# Patient Record
Sex: Male | Born: 1965 | Race: White | Hispanic: No | Smoking: Never smoker
Health system: Southern US, Community
[De-identification: ages and names within clinical notes are randomized; demographics above are authoritative.]

## PROBLEM LIST (undated history)

## (undated) DIAGNOSIS — Z87442 Personal history of urinary calculi: Secondary | ICD-10-CM

## (undated) DIAGNOSIS — N200 Calculus of kidney: Secondary | ICD-10-CM

## (undated) DIAGNOSIS — I10 Essential (primary) hypertension: Secondary | ICD-10-CM

## (undated) HISTORY — PX: JOINT REPLACEMENT: SHX530

## (undated) HISTORY — DX: Calculus of kidney: N20.0

---

## 2008-08-23 ENCOUNTER — Ambulatory Visit: Payer: Self-pay | Admitting: General Practice

## 2011-08-15 ENCOUNTER — Other Ambulatory Visit: Payer: Self-pay | Admitting: Internal Medicine

## 2011-08-16 ENCOUNTER — Telehealth: Payer: Self-pay | Admitting: Internal Medicine

## 2011-08-16 NOTE — Telephone Encounter (Signed)
[phone] work.   Pt has had problems getting his rx filled.  His pharmacy [facility].   Bryan Galloway has been trying to call here,   Anxiety, BP, Hydrocodine.  Pt signed medial release form at [facility] 1  1/2 ago.    Pt is out of meds

## 2011-08-17 MED ORDER — BISOPROLOL-HYDROCHLOROTHIAZIDE 5-6.25 MG PO TABS: 1.0000 | ORAL_TABLET | Freq: Every day | ORAL | Status: DC

## 2011-10-22 ENCOUNTER — Other Ambulatory Visit: Payer: Self-pay | Admitting: Internal Medicine

## 2011-10-22 MED ORDER — LISINOPRIL 10 MG PO TABS: 10.0000 mg | ORAL_TABLET | Freq: Every day | ORAL | Status: DC

## 2011-10-22 NOTE — Telephone Encounter (Signed)
Refill on lisinopril  10  Mg daily  #30 6 refills authorized

## 2012-02-06 ENCOUNTER — Other Ambulatory Visit: Payer: Self-pay | Admitting: Internal Medicine

## 2012-02-06 NOTE — Telephone Encounter (Signed)
Patient has not been seen in office yet.  Please advise. 

## 2012-03-26 ENCOUNTER — Telehealth: Payer: Self-pay | Admitting: Internal Medicine

## 2012-03-26 ENCOUNTER — Other Ambulatory Visit: Payer: Self-pay | Admitting: Internal Medicine

## 2012-03-26 NOTE — Telephone Encounter (Signed)
He needs to have his electrolytes and kidney function checked every 6 months while taking this medication.  This is pretty standard and for his own protection .  If [facility] has checked his kidney function,  He can have his medication refilled, but I would appreciate it if he asked them to send me copies of the labs.

## 2012-03-26 NOTE — Telephone Encounter (Signed)
Left message asking patient to return my call.

## 2012-03-26 NOTE — Telephone Encounter (Signed)
Patient called and requested his BP medication be sent to the pharmacy, I advised him per Dr. Darrick Huntsman that he needed an appt prior to any refills.   Patient stated he will not make an appt because he doesn't have anything going on right now except for an upcoming knee replacement, and [facility] was already handling that.  I spoke with Dr. Darrick Huntsman and she stated he needs an appt because he is on BP medication and his kidney function needs to be monitored, and he will probably need medical clearance for the upcoming surgery.  He refused to make an appt still and wants the medication called in, he also stated he does not need medical clearance.  He asked to speak to Dr. Darrick Huntsman and I advised she was with a patient and that I would call him back when she advised on the medication.

## 2012-03-27 NOTE — Telephone Encounter (Signed)
Patient notified, he made an appt for tomorrow. 

## 2012-03-28 ENCOUNTER — Ambulatory Visit (INDEPENDENT_AMBULATORY_CARE_PROVIDER_SITE_OTHER): Payer: Self-pay | Admitting: Internal Medicine

## 2012-03-28 VITALS — BP 128/72 | HR 86 | Temp 98.4°F | Resp 16 | Ht 75.0 in | Wt 253.5 lb

## 2012-03-28 DIAGNOSIS — Z79899 Other long term (current) drug therapy: Secondary | ICD-10-CM

## 2012-03-28 DIAGNOSIS — M1990 Unspecified osteoarthritis, unspecified site: Secondary | ICD-10-CM

## 2012-03-28 DIAGNOSIS — I10 Essential (primary) hypertension: Secondary | ICD-10-CM

## 2012-03-28 LAB — COMPREHENSIVE METABOLIC PANEL
ALT: 16 U/L (ref 0–53)
AST: 12 U/L (ref 0–37)
Albumin: 4.8 g/dL (ref 3.5–5.2)
Alkaline Phosphatase: 47 U/L (ref 39–117)
BUN: 17 mg/dL (ref 6–23)
CO2: 24 mEq/L (ref 19–32)
Calcium: 9.4 mg/dL (ref 8.4–10.5)
Chloride: 103 mEq/L (ref 96–112)
Creat: 0.93 mg/dL (ref 0.50–1.35)
Glucose, Bld: 111 mg/dL — ABNORMAL HIGH (ref 70–99)
Potassium: 4.1 mEq/L (ref 3.5–5.3)
Sodium: 139 mEq/L (ref 135–145)
Total Bilirubin: 0.5 mg/dL (ref 0.3–1.2)
Total Protein: 6.7 g/dL (ref 6.0–8.3)

## 2012-03-28 MED ORDER — HYDROCODONE-ACETAMINOPHEN 5-500 MG PO TABS: 1.0000 | ORAL_TABLET | Freq: Four times a day (QID) | ORAL | Status: AC | PRN

## 2012-03-28 MED ORDER — BISOPROLOL-HYDROCHLOROTHIAZIDE 5-6.25 MG PO TABS: 1.0000 | ORAL_TABLET | Freq: Every day | ORAL | Status: DC

## 2012-03-30 ENCOUNTER — Encounter: Payer: Self-pay | Admitting: Internal Medicine

## 2012-03-30 DIAGNOSIS — G8929 Other chronic pain: Secondary | ICD-10-CM | POA: Insufficient documentation

## 2012-03-30 DIAGNOSIS — I10 Essential (primary) hypertension: Secondary | ICD-10-CM | POA: Insufficient documentation

## 2012-03-30 DIAGNOSIS — N200 Calculus of kidney: Secondary | ICD-10-CM | POA: Insufficient documentation

## 2012-03-30 NOTE — Assessment & Plan Note (Addendum)
awaiting hip replacement.  Has pain worse in the evening,  Using meloxiocam in the daytime.  rx for vicodin #120 given.

## 2012-03-30 NOTE — Assessment & Plan Note (Signed)
  well controlled on current regimen. He has had no renal calculi in years despite taking hctz.  Repeat BMET due prior to refills.

## 2012-03-30 NOTE — Progress Notes (Signed)
Patient ID: Bryan Galloway, male   DOB: 14-Oct-1966, 46 y.o.   MRN: [id number]  . Patient Active Problem List  Diagnoses  . Nephrolithiasis  . Hypertension  . Degenerative joint disease    Subjective:  CC:   No chief complaint on file.   HPI:   Bryan Galloway a 46 y.o. male who presents  Follow up on hypertension and hip pain .  He has been taking his medications without side effects and his bp has ben well controlled by outside checks.  He is planning to have a left hip replacement in the near future.   Past Medical History  Diagnosis Date  . Nephrolithiasis     History reviewed. No pertinent past surgical history.       The following portions of the patient's history were reviewed and updated as appropriate: Allergies, current medications, and problem list.    Review of Systems:   12 Pt  review of systems was negative except those addressed in the HPI,     History   Social History  . Marital Status: Unknown    Spouse Name: N/A    Number of Children: N/A  . Years of Education: N/A   Occupational History  . Not on file.   Social History Main Topics  . Smoking status: Never Smoker   . Smokeless tobacco: Not on file  . Alcohol Use: No  . Drug Use: No  . Sexually Active: Not Currently -- Male partner(s)   Other Topics Concern  . Not on file   Social History Narrative  . No narrative on file    Objective:  BP 128/72  Pulse 86  Temp 98.4 F (36.9 C)  Resp 16  Ht 6\' 3"  (1.905 m)  Wt 253 lb 8 oz (114.987 kg)  BMI 31.69 kg/m2  SpO2 96%  General appearance: alert, cooperative and appears stated age Ears: normal TM's and external ear canals both ears Throat: lips, mucosa, and tongue normal; teeth and gums normal Neck: no adenopathy, no carotid bruit, supple, symmetrical, trachea midline and thyroid not enlarged, symmetric, no tenderness/mass/nodules Back: symmetric, no curvature. ROM normal. No CVA tenderness. Lungs: clear to  auscultation bilaterally Heart: regular rate and rhythm, S1, S2 normal, no murmur, click, rub or gallop Abdomen: soft, non-tender; bowel sounds normal; no masses,  no organomegaly Pulses: 2+ and symmetric Skin: Skin color, texture, turgor normal. No rashes or lesions Lymph nodes: Cervical, supraclavicular, and axillary nodes normal.  Assessment and Plan:  Hypertension  well controlled on current regimen. He has had no renal calculi in years despite taking hctz.  Repeat BMET due prior to refills.    Degenerative joint disease awaiting hip replacement    Updated Medication List Outpatient Encounter Prescriptions as of 03/28/2012  Medication Sig Dispense Refill  . bisoprolol-hydrochlorothiazide (ZIAC) 5-6.25 MG per tablet Take 1 tablet by mouth daily.  30 tablet  6  . HYDROcodone-acetaminophen (VICODIN) 5-500 MG per tablet Take 1 tablet by mouth every 6 (six) hours as needed for pain.  120 tablet  0  . DISCONTD: bisoprolol-hydrochlorothiazide (ZIAC) 5-6.25 MG per tablet TAKE 1 TABLET DAILY  30 tablet  0     Orders Placed This Encounter  Procedures  . Comp Met (CMET)    No Follow-up on file.

## 2012-04-01 ENCOUNTER — Other Ambulatory Visit: Payer: Self-pay | Admitting: Internal Medicine

## 2012-04-01 MED ORDER — BISOPROLOL-HYDROCHLOROTHIAZIDE 5-6.25 MG PO TABS: 1.0000 | ORAL_TABLET | Freq: Every day | ORAL | Status: DC

## 2012-04-07 ENCOUNTER — Encounter: Payer: Self-pay | Admitting: Internal Medicine

## 2012-10-21 ENCOUNTER — Other Ambulatory Visit: Payer: Self-pay | Admitting: Internal Medicine

## 2012-11-17 ENCOUNTER — Other Ambulatory Visit: Payer: Self-pay | Admitting: Internal Medicine

## 2012-11-17 NOTE — Telephone Encounter (Signed)
Med filled.  

## 2013-04-07 ENCOUNTER — Other Ambulatory Visit: Payer: Self-pay | Admitting: *Deleted

## 2013-04-07 NOTE — Telephone Encounter (Signed)
Needs follow-up

## 2013-04-07 NOTE — Telephone Encounter (Signed)
Patient stated he will call and set up follow up appointment but not at this time.

## 2013-04-07 NOTE — Telephone Encounter (Signed)
Patient has not had office visit since 5/13 please advise.

## 2014-12-27 ENCOUNTER — Ambulatory Visit (INDEPENDENT_AMBULATORY_CARE_PROVIDER_SITE_OTHER): Payer: 59 | Admitting: Internal Medicine

## 2014-12-27 ENCOUNTER — Encounter: Payer: Self-pay | Admitting: Internal Medicine

## 2014-12-27 VITALS — BP 140/84 | HR 50 | Temp 98.8°F | Resp 16 | Ht 75.0 in | Wt 279.2 lb

## 2014-12-27 DIAGNOSIS — Z125 Encounter for screening for malignant neoplasm of prostate: Secondary | ICD-10-CM

## 2014-12-27 DIAGNOSIS — Z8679 Personal history of other diseases of the circulatory system: Secondary | ICD-10-CM

## 2014-12-27 DIAGNOSIS — Z1159 Encounter for screening for other viral diseases: Secondary | ICD-10-CM

## 2014-12-27 DIAGNOSIS — M160 Bilateral primary osteoarthritis of hip: Secondary | ICD-10-CM

## 2014-12-27 DIAGNOSIS — Z23 Encounter for immunization: Secondary | ICD-10-CM

## 2014-12-27 DIAGNOSIS — I10 Essential (primary) hypertension: Secondary | ICD-10-CM

## 2014-12-27 DIAGNOSIS — R635 Abnormal weight gain: Secondary | ICD-10-CM

## 2014-12-27 DIAGNOSIS — G4700 Insomnia, unspecified: Secondary | ICD-10-CM

## 2014-12-27 MED ORDER — ZOLPIDEM TARTRATE 10 MG PO TABS: 10.0000 mg | ORAL_TABLET | Freq: Every evening | ORAL | Status: DC | PRN

## 2014-12-27 MED ORDER — ALPRAZOLAM 1 MG PO TABS: 1.0000 mg | ORAL_TABLET | Freq: Two times a day (BID) | ORAL | Status: DC | PRN

## 2014-12-27 NOTE — Assessment & Plan Note (Addendum)
S/p bilateral hip replacements .  He is currently pain free.  Encouraged to start and exercise program.

## 2014-12-27 NOTE — Progress Notes (Signed)
Pre-visit discussion using our clinic review tool. No additional management support is needed unless otherwise documented below in the visit note.  

## 2014-12-27 NOTE — Progress Notes (Signed)
Patient ID: Bryan Galloway, male   DOB: 1966/04/19, 49 y.o.   MRN: [id number]   Patient Active Problem List   Diagnosis Date Noted  . Essential hypertension 12/29/2014  . History of hypertension 12/29/2014  . Insomnia 12/27/2014  . Degenerative joint disease 03/30/2012  . Nephrolithiasis     Subjective:  CC:   Chief Complaint  Patient presents with  . Follow-up    Re-establish care    HPI:   Bryan Galloway is a 49 y.o. male who presents for  Follow up on chronic conditions including hypertension, bilateral hip pain. He was last seen 2 years ago. IN THE INTERIM HE HAD BOTH HIPS REPLACED,  [facility]   DR CLIFFORD,  LEFT ,THEN RIGHT AT ONE YEAR. Pain has resolved and he is more active now , but has gained weight nonetheless.   HISTORY OF HYPERTENSION treated with bisoprolol hctz  Father died before Christmas.  He is not sleeping well,  Alprazolam has not been effective.  Would like to resume [place]      Past Medical History  Diagnosis Date  . Nephrolithiasis     Past Surgical History  Procedure Laterality Date  . Joint replacement      bilateral hips  [facility]       The following portions of the patient's history were reviewed and updated as appropriate: Allergies, current medications, and problem list.    Review of Systems:   Patient denies headache, fevers, malaise, unintentional weight loss, skin rash, eye pain, sinus congestion and sinus pain, sore throat, dysphagia,  hemoptysis , cough, dyspnea, wheezing, chest pain, palpitations, orthopnea, edema, abdominal pain, nausea, melena, diarrhea, constipation, flank pain, dysuria, hematuria, urinary  Frequency, nocturia, numbness, tingling, seizures,  Focal weakness, Loss of consciousness,  Tremor, insomnia, depression, anxiety, and suicidal ideation.     History   Social History  . Marital Status: Unknown    Spouse Name: N/A    Number of Children: N/A  . Years of Education:  N/A   Occupational History  . Not on file.   Social History Main Topics  . Smoking status: Never Smoker   . Smokeless tobacco: Not on file  . Alcohol Use: No  . Drug Use: No  . Sexual Activity:    Partners: Female   Other Topics Concern  . Not on file   Social History Narrative    Objective:  Filed Vitals:   12/27/14 1425  BP: 140/84  Pulse: 50  Temp: 98.8 F (37.1 C)  Resp: 16     General appearance: alert, cooperative and appears stated age Ears: normal TM's and external ear canals both ears Throat: lips, mucosa, and tongue normal; teeth and gums normal Neck: no adenopathy, no carotid bruit, supple, symmetrical, trachea midline and thyroid not enlarged, symmetric, no tenderness/mass/nodules Back: symmetric, no curvature. ROM normal. No CVA tenderness. Lungs: clear to auscultation bilaterally Heart: regular rate and rhythm, S1, S2 normal, no murmur, click, rub or gallop Abdomen: soft, non-tender; bowel sounds normal; no masses,  no organomegaly Pulses: 2+ and symmetric Skin: Skin color, texture, turgor normal. No rashes or lesions Lymph nodes: Cervical, supraclavicular, and axillary nodes normal.  Assessment and Plan:  Degenerative joint disease S/p bilateral hip replacements .  He is currently pain free.  Encouraged to start and exercise program.    Insomnia Managed with ambien.  The risks and benefits of ambien use were reviewed with patient today including excessive sedation leading to respiratory depression,  impaired  thinking/driving, and addiction.  Patient was advised to avoid concurrent use with alcohol, to use medication only as needed, not to exceed 10 mg and  and not to share with others. Refills given.    Essential hypertension Currently normotesnive off of medication. Advise to resume medication for bp > 140/80    Updated Medication List Outpatient Encounter Prescriptions as of 12/27/2014  Medication Sig  . ALPRAZolam (XANAX) 1 MG tablet Take 1  tablet (1 mg total) by mouth 2 (two) times daily as needed for anxiety.  Marland Kitchen VALTREX 1 G tablet   . zolpidem (AMBIEN) 10 MG tablet Take 1 tablet (10 mg total) by mouth at bedtime as needed for sleep.  . [DISCONTINUED] ALPRAZolam (XANAX) 0.5 MG tablet Take 0.5 mg by mouth at bedtime as needed for anxiety.  . [DISCONTINUED] bisoprolol-hydrochlorothiazide (ZIAC) 5-6.25 MG per tablet Take 1 tablet by mouth daily.  . [DISCONTINUED] bisoprolol-hydrochlorothiazide (ZIAC) 5-6.25 MG per tablet TAKE 1 TABLET BY MOUTH DAILY.  . [DISCONTINUED] bisoprolol-hydrochlorothiazide (ZIAC) 5-6.25 MG per tablet TAKE 1 TABLET BY MOUTH DAILY. (Patient not taking: Reported on 12/27/2014)  . [DISCONTINUED] meloxicam (MOBIC) 15 MG tablet TAKE 1 TABLET EVERY DAY (Patient not taking: Reported on 12/27/2014)  . [DISCONTINUED] predniSONE (STERAPRED UNI-PAK) 10 MG tablet   . [DISCONTINUED] zolpidem (AMBIEN) 10 MG tablet      Orders Placed This Encounter  Procedures  . Tdap vaccine greater than or equal to 7yo IM  . Comprehensive metabolic panel  . PSA  . TSH  . Hepatitis C antibody    Return in about 6 months (around 06/27/2015).

## 2014-12-27 NOTE — Patient Instructions (Signed)
Welcome back!  You do NOT need to resume BP medication unless you find that your pressure is consistently > 140/90  'do not take more than 10 mg ambien per night,  You may add 1/2 tablet of alprazolam if needed

## 2014-12-28 LAB — COMPREHENSIVE METABOLIC PANEL
ALT: 25 U/L (ref 0–53)
AST: 19 U/L (ref 0–37)
Albumin: 4.6 g/dL (ref 3.5–5.2)
Alkaline Phosphatase: 64 U/L (ref 39–117)
BUN: 14 mg/dL (ref 6–23)
CO2: 28 mEq/L (ref 19–32)
Calcium: 9.6 mg/dL (ref 8.4–10.5)
Chloride: 106 mEq/L (ref 96–112)
Creatinine, Ser: 1.07 mg/dL (ref 0.40–1.50)
GFR: 77.46 mL/min (ref >60.00)
Glucose, Bld: 90 mg/dL (ref 70–99)
Potassium: 4 mEq/L (ref 3.5–5.1)
Sodium: 140 mEq/L (ref 135–145)
Total Bilirubin: 0.5 mg/dL (ref 0.2–1.2)
Total Protein: 6.7 g/dL (ref 6.0–8.3)

## 2014-12-28 LAB — HEPATITIS C ANTIBODY: HCV Ab: NEGATIVE

## 2014-12-28 LAB — TSH: TSH: 1.16 u[IU]/mL (ref 0.35–4.50)

## 2014-12-28 LAB — PSA: PSA: 0.9 ng/mL (ref 0.10–4.00)

## 2014-12-29 DIAGNOSIS — I10 Essential (primary) hypertension: Secondary | ICD-10-CM | POA: Insufficient documentation

## 2014-12-29 DIAGNOSIS — Z8679 Personal history of other diseases of the circulatory system: Secondary | ICD-10-CM | POA: Insufficient documentation

## 2014-12-29 NOTE — Assessment & Plan Note (Signed)
Currently normotesnive off of medication. Advise to resume medication for bp > 140/80

## 2014-12-29 NOTE — Assessment & Plan Note (Addendum)
Managed with ambien.  The risks and benefits of ambien use were reviewed with patient today including excessive sedation leading to respiratory depression,  impaired thinking/driving, and addiction.  Patient was advised to avoid concurrent use with alcohol, to use medication only as needed, not to exceed 10 mg and  and not to share with others. Refills given.    

## 2014-12-30 ENCOUNTER — Encounter: Payer: Self-pay | Admitting: *Deleted

## 2015-05-12 ENCOUNTER — Other Ambulatory Visit: Payer: Self-pay | Admitting: Internal Medicine

## 2015-05-12 NOTE — Telephone Encounter (Signed)
Last visit 12/27/14, has 6 month follow up 06/27/15

## 2015-05-12 NOTE — Telephone Encounter (Signed)
Ok to refill,  printed rx  

## 2015-05-13 NOTE — Telephone Encounter (Signed)
Faxed to pharmacy

## 2015-06-27 ENCOUNTER — Encounter: Payer: 59 | Admitting: Internal Medicine

## 2015-09-13 ENCOUNTER — Encounter: Payer: 59 | Admitting: Internal Medicine

## 2015-10-18 ENCOUNTER — Other Ambulatory Visit: Payer: Self-pay | Admitting: Internal Medicine

## 2015-10-23 NOTE — Telephone Encounter (Signed)
.  Refill for 30 days only.  OFFICE VISIT NEEDED prior to any more refills, and there will be no exceptiobs

## 2015-10-24 NOTE — Telephone Encounter (Signed)
Left message per dpr patient will need appointment for further rerfills

## 2015-12-17 ENCOUNTER — Other Ambulatory Visit: Payer: Self-pay | Admitting: Internal Medicine

## 2015-12-17 NOTE — Telephone Encounter (Signed)
Last seen in 2/16 (Last 2 appt cancelled). Has appt on 12/20/15. Okay to refill or hold off until appt?

## 2015-12-18 NOTE — Telephone Encounter (Signed)
No refills until seen  

## 2015-12-20 ENCOUNTER — Encounter: Payer: Self-pay | Admitting: Internal Medicine

## 2015-12-20 ENCOUNTER — Ambulatory Visit (INDEPENDENT_AMBULATORY_CARE_PROVIDER_SITE_OTHER): Payer: BLUE CROSS/BLUE SHIELD | Admitting: Internal Medicine

## 2015-12-20 VITALS — BP 144/100 | HR 81 | Temp 98.1°F | Resp 14 | Ht 75.0 in | Wt 264.1 lb

## 2015-12-20 DIAGNOSIS — I10 Essential (primary) hypertension: Secondary | ICD-10-CM

## 2015-12-20 DIAGNOSIS — Z1159 Encounter for screening for other viral diseases: Secondary | ICD-10-CM | POA: Diagnosis not present

## 2015-12-20 DIAGNOSIS — E781 Pure hyperglyceridemia: Secondary | ICD-10-CM

## 2015-12-20 DIAGNOSIS — D229 Melanocytic nevi, unspecified: Secondary | ICD-10-CM

## 2015-12-20 DIAGNOSIS — D239 Other benign neoplasm of skin, unspecified: Secondary | ICD-10-CM | POA: Diagnosis not present

## 2015-12-20 DIAGNOSIS — Z0000 Encounter for general adult medical examination without abnormal findings: Secondary | ICD-10-CM

## 2015-12-20 DIAGNOSIS — E785 Hyperlipidemia, unspecified: Secondary | ICD-10-CM | POA: Diagnosis not present

## 2015-12-20 DIAGNOSIS — R5383 Other fatigue: Secondary | ICD-10-CM

## 2015-12-20 DIAGNOSIS — Z125 Encounter for screening for malignant neoplasm of prostate: Secondary | ICD-10-CM | POA: Diagnosis not present

## 2015-12-20 DIAGNOSIS — M153 Secondary multiple arthritis: Secondary | ICD-10-CM

## 2015-12-20 DIAGNOSIS — G4700 Insomnia, unspecified: Secondary | ICD-10-CM

## 2015-12-20 DIAGNOSIS — E559 Vitamin D deficiency, unspecified: Secondary | ICD-10-CM | POA: Diagnosis not present

## 2015-12-20 MED ORDER — ZOLPIDEM TARTRATE 10 MG PO TABS: 10.0000 mg | ORAL_TABLET | Freq: Every evening | ORAL | Status: DC | PRN

## 2015-12-20 MED ORDER — TRAMADOL HCL 50 MG PO TABS: 50.0000 mg | ORAL_TABLET | Freq: Four times a day (QID) | ORAL | Status: DC | PRN

## 2015-12-20 MED ORDER — ALPRAZOLAM 1 MG PO TABS: ORAL_TABLET | ORAL | Status: DC

## 2015-12-20 NOTE — Patient Instructions (Addendum)
The meloxicam may be elevating your blood pressure  Suspend it for one week and use the tramadol every 6 hours if needed  Check BP at home or at pharmacy at rest.   Goal is 130/80 or less    Cologuard will be sent to your home for colon ca screen   Referral to Dr Evorn Gong   Health Maintenance, Male A healthy lifestyle and preventative care can promote health and wellness.  Maintain regular health, dental, and eye exams.  Eat a healthy diet. Foods like vegetables, fruits, whole grains, low-fat dairy products, and lean protein foods contain the nutrients you need and are low in calories. Decrease your intake of foods high in solid fats, added sugars, and salt. Get information about a proper diet from your health care provider, if necessary.  Regular physical exercise is one of the most important things you can do for your health. Most adults should get at least 150 minutes of moderate-intensity exercise (any activity that increases your heart rate and causes you to sweat) each week. In addition, most adults need muscle-strengthening exercises on 2 or more days a week.   Maintain a healthy weight. The body mass index (BMI) is a screening tool to identify possible weight problems. It provides an estimate of body fat based on height and weight. Your health care provider can find your BMI and can help you achieve or maintain a healthy weight. For males 20 years and older:  A BMI below 18.5 is considered underweight.  A BMI of 18.5 to 24.9 is normal.  A BMI of 25 to 29.9 is considered overweight.  A BMI of 30 and above is considered obese.  Maintain normal blood lipids and cholesterol by exercising and minimizing your intake of saturated fat. Eat a balanced diet with plenty of fruits and vegetables. Blood tests for lipids and cholesterol should begin at age 29 and be repeated every 5 years. If your lipid or cholesterol levels are high, you are over age 37, or you are at high risk for heart  disease, you may need your cholesterol levels checked more frequently.Ongoing high lipid and cholesterol levels should be treated with medicines if diet and exercise are not working.  If you smoke, find out from your health care provider how to quit. If you do not use tobacco, do not start.  Lung cancer screening is recommended for adults aged 84-80 years who are at high risk for developing lung cancer because of a history of smoking. A yearly low-dose CT scan of the lungs is recommended for people who have at least a 30-pack-year history of smoking and are current smokers or have quit within the past 15 years. A pack year of smoking is smoking an average of 1 pack of cigarettes a day for 1 year (for example, a 30-pack-year history of smoking could mean smoking 1 pack a day for 30 years or 2 packs a day for 15 years). Yearly screening should continue until the smoker has stopped smoking for at least 15 years. Yearly screening should be stopped for people who develop a health problem that would prevent them from having lung cancer treatment.  If you choose to drink alcohol, do not have more than 2 drinks per day. One drink is considered to be 12 oz (360 mL) of beer, 5 oz (150 mL) of wine, or 1.5 oz (45 mL) of liquor.  Avoid the use of street drugs. Do not share needles with anyone. Ask for help if you  need support or instructions about stopping the use of drugs.  High blood pressure causes heart disease and increases the risk of stroke. High blood pressure is more likely to develop in:  People who have blood pressure in the end of the normal range (100-139/85-89 mm Hg).  People who are overweight or obese.  People who are African American.  If you are 48-65 years of age, have your blood pressure checked every 3-5 years. If you are 61 years of age or older, have your blood pressure checked every year. You should have your blood pressure measured twice--once when you are at a hospital or clinic, and  once when you are not at a hospital or clinic. Record the average of the two measurements. To check your blood pressure when you are not at a hospital or clinic, you can use:  An automated blood pressure machine at a pharmacy.  A home blood pressure monitor.  If you are 14-54 years old, ask your health care provider if you should take aspirin to prevent heart disease.  Diabetes screening involves taking a blood sample to check your fasting blood sugar level. This should be done once every 3 years after age 34 if you are at a normal weight and without risk factors for diabetes. Testing should be considered at a younger age or be carried out more frequently if you are overweight and have at least 1 risk factor for diabetes.  Colorectal cancer can be detected and often prevented. Most routine colorectal cancer screening begins at the age of 15 and continues through age 47. However, your health care provider may recommend screening at an earlier age if you have risk factors for colon cancer. On a yearly basis, your health care provider may provide home test kits to check for hidden blood in the stool. A small camera at the end of a tube may be used to directly examine the colon (sigmoidoscopy or colonoscopy) to detect the earliest forms of colorectal cancer. Talk to your health care provider about this at age 13 when routine screening begins. A direct exam of the colon should be repeated every 5-10 years through age 4, unless early forms of precancerous polyps or small growths are found.  People who are at an increased risk for hepatitis B should be screened for this virus. You are considered at high risk for hepatitis B if:  You were born in a country where hepatitis B occurs often. Talk with your health care provider about which countries are considered high risk.  Your parents were born in a high-risk country and you have not received a shot to protect against hepatitis B (hepatitis B  vaccine).  You have HIV or AIDS.  You use needles to inject street drugs.  You live with, or have sex with, someone who has hepatitis B.  You are a man who has sex with other men (MSM).  You get hemodialysis treatment.  You take certain medicines for conditions like cancer, organ transplantation, and autoimmune conditions.  Hepatitis C blood testing is recommended for all people born from [birth year] through 1967 and any individual with known risk factors for hepatitis C.  Healthy men should no longer receive prostate-specific antigen (PSA) blood tests as part of routine cancer screening. Talk to your health care provider about prostate cancer screening.  Testicular cancer screening is not recommended for adolescents or adult males who have no symptoms. Screening includes self-exam, a health care provider exam, and other screening tests. Consult with  your health care provider about any symptoms you have or any concerns you have about testicular cancer.  Practice safe sex. Use condoms and avoid high-risk sexual practices to reduce the spread of sexually transmitted infections (STIs).  You should be screened for STIs, including gonorrhea and chlamydia if:  You are sexually active and are younger than 24 years.  You are older than 24 years, and your health care provider tells you that you are at risk for this type of infection.  Your sexual activity has changed since you were last screened, and you are at an increased risk for chlamydia or gonorrhea. Ask your health care provider if you are at risk.  If you are at risk of being infected with HIV, it is recommended that you take a prescription medicine daily to prevent HIV infection. This is called pre-exposure prophylaxis (PrEP). You are considered at risk if:  You are a man who has sex with other men (MSM).  You are a heterosexual man who is sexually active with multiple partners.  You take drugs by injection.  You are sexually active  with a partner who has HIV.  Talk with your health care provider about whether you are at high risk of being infected with HIV. If you choose to begin PrEP, you should first be tested for HIV. You should then be tested every 3 months for as long as you are taking PrEP.  Use sunscreen. Apply sunscreen liberally and repeatedly throughout the day. You should seek shade when your shadow is shorter than you. Protect yourself by wearing long sleeves, pants, a wide-brimmed hat, and sunglasses year round whenever you are outdoors.  Tell your health care provider of new moles or changes in moles, especially if there is a change in shape or color. Also, tell your health care provider if a mole is larger than the size of a pencil eraser.  A one-time screening for abdominal aortic aneurysm (AAA) and surgical repair of large AAAs by ultrasound is recommended for men aged 71-75 years who are current or former smokers.  Stay current with your vaccines (immunizations).   This information is not intended to replace advice given to you by your health care provider. Make sure you discuss any questions you have with your health care provider.   Document Released: 05/10/2008 Document Revised: 12/03/2014 Document Reviewed: 04/09/2011 Elsevier Interactive Patient Education [employer].

## 2015-12-20 NOTE — Progress Notes (Signed)
Pre-visit discussion using our clinic review tool. No additional management support is needed unless otherwise documented below in the visit note.  

## 2015-12-20 NOTE — Assessment & Plan Note (Addendum)
Now with bilateral knee pain  Aggravated by physical exertion .  Continue meloxicam once NSAID effect on BP explored,  tramadol rx given for use during suspension of mobic.

## 2015-12-20 NOTE — Progress Notes (Signed)
Patient ID: Bryan Galloway, male    DOB: July 26, 1966  Age: 50 y.o. MRN: [id number]  The patient is here for annualwellness examination and management of other chronic and acute problems.  He was last seen one year ago.    The risk factors are reflected in the social history.  The roster of all physicians providing medical care to patient - is listed in the Snapshot section of the chart.  Activities of daily living:  The patient is 100% independent in all ADLs: dressing, toileting, feeding as well as independent mobility  Home safety : The patient has smoke detectors in the home. They wear seatbelts.  There are no firearms at home. There is no violence in the home.   There is no risks for hepatitis, STDs or HIV. There is no   history of blood transfusion. They have no travel history to infectious disease endemic areas of the world.  The patient has seen their dentist in the last six month. They have seen their eye doctor in the last year. They admit to slight hearing difficulty with regard to whispered voices and some television programs.  They have deferred audiologic testing in the last year.  They do not  have excessive sun exposure. Discussed the need for sun protection: hats, long sleeves and use of sunscreen if there is significant sun exposure.   Diet: the importance of a healthy diet is discussed. They do have a healthy diet.  The benefits of regular aerobic exercise were discussed. He does not exercise due to long work days    Depression screen: there are no signs or vegative symptoms of depression- irritability, change in appetite, anhedonia, sadness/tearfullness.  Cognitive assessment: the patient manages all their financial and personal affairs and is actively engaged. They could relate day,date,year and events; recalled 2/3 objects at 3 minutes; performed clock-face test normally.  The following portions of the patient's history were reviewed and updated as appropriate:  allergies, current medications, past family history, past medical history,  past surgical history, past social history  and problem list.  Visual acuity was not assessed per patient preference since she has regular follow up with her ophthalmologist. Hearing and body mass index were assessed and reviewed.   During the course of the visit the patient was educated and counseled about appropriate screening and preventive services including : fall prevention , diabetes screening, nutrition counseling, colorectal cancer screening, and recommended immunizations.    CC: The primary encounter diagnosis was Visit for preventive health examination. Diagnoses of Hyperlipidemia, Other fatigue, Vitamin D deficiency, Screening for prostate cancer, Need for hepatitis C screening test, Atypical nevus, Hypertriglyceridemia, Essential hypertension, Secondary osteoarthritis of multiple sites, and Insomnia were also pertinent to this visit.  Has lost 15 lbs this year  bilateral knee pain . Walking 11 hours daily at [employer] .  Has been taking meloxiciam 15 mg for the past 2 weeks,   BP is elevated,  Does not check it at home  feels it is due to aggravation at work Uses ambien once every 2 weeks Due for eye exam Wants to do the cologuard . 2 moles irregular,  Right shoulder,  Base of neck  History Bryan Galloway has a past medical history of Nephrolithiasis.   He has past surgical history that includes Joint replacement.   His family history includes Cancer (age of onset: 70) in his mother; Heart disease in his father.He reports that he has never smoked. He does not have any smokeless tobacco history  on file. He reports that he does not drink alcohol or use illicit drugs.  Outpatient Prescriptions Prior to Visit  Medication Sig Dispense Refill  . ALPRAZolam (XANAX) 1 MG tablet TAKE ONE TABLET TWICE DAILY AS NEEDED FOR ANXIETY 30 tablet 0  . zolpidem (AMBIEN) 10 MG tablet Take 1 tablet (10 mg total) by mouth at  bedtime as needed for sleep. 30 tablet 5  . VALTREX 1 G tablet Reported on 12/20/2015     No facility-administered medications prior to visit.    Review of Systems   Patient denies headache, fevers, malaise, unintentional weight loss, skin rash, eye pain, sinus congestion and sinus pain, sore throat, dysphagia,  hemoptysis , cough, dyspnea, wheezing, chest pain, palpitations, orthopnea, edema, abdominal pain, nausea, melena, diarrhea, constipation, flank pain, dysuria, hematuria, urinary  Frequency, nocturia, numbness, tingling, seizures,  Focal weakness, Loss of consciousness,  Tremor, insomnia, depression, anxiety, and suicidal ideation.     Objective:  BP 144/100 mmHg  Pulse 81  Temp(Src) 98.1 F (36.7 C) (Oral)  Resp 14  Ht 6\' 3"  (1.905 m)  Wt 264 lb 2 oz (119.806 kg)  BMI 33.01 kg/m2  SpO2 98%  Physical Exam  General appearance: alert, cooperative and appears stated age Ears: normal TM's and external ear canals both ears Throat: lips, mucosa, and tongue normal; teeth and gums normal Neck: no adenopathy, no carotid bruit, supple, symmetrical, trachea midline and thyroid not enlarged, symmetric, no tenderness/mass/nodules Back: symmetric, no curvature. ROM normal. No CVA tenderness. Lungs: clear to auscultation bilaterally Heart: regular rate and rhythm, S1, S2 normal, no murmur, click, rub or gallop Abdomen: soft, non-tender; bowel sounds normal; no masses,  no organomegaly Pulses: 2+ and symmetric Skin: Skin color, texture, turgor normal. No rashes or lesions Lymph nodes: Cervical, supraclavicular, and axillary nodes normal.   Assessment & Plan:   Problem List Items Addressed This Visit    Degenerative joint disease    Now with bilateral knee pain  Aggravated by physical exertion .  Continue meloxicam once NSAID effect on BP explored,  tramadol rx given for use during suspension of mobic.       Relevant Medications   traMADol (ULTRAM) 50 MG tablet   Insomnia     Managed with ambien.  The risks and benefits of ambien use were reviewed with patient today including excessive sedation leading to respiratory depression,  impaired thinking/driving, and addiction.  Patient was advised to avoid concurrent use with alcohol, to use medication only as needed, not to exceed 10 mg and  and not to share with others. Refills given.         Essential hypertension    Asked patient to suspend meloxicam for one week and  check BP at home over the next 4 weeks . Lab Results  Component Value Date   CREATININE 1.01 12/20/2015   Lab Results  Component Value Date   NA 141 12/20/2015   K 3.9 12/20/2015   CL 104 12/20/2015   CO2 28 12/20/2015         Vitamin D deficiency    Level is low at 20 .  Drisdol x 3 months       Relevant Orders   VITAMIN D 25 Hydroxy (Vit-D Deficiency, Fractures) (Completed)   Visit for preventive health examination - Primary    Annual comprehensive preventive exam was done as well as an evaluation and management of acute and chronic conditions .  During the course of the visit the patient was educated  and counseled about appropriate screening and preventive services including :  diabetes screening, lipid analysis with projected  10 year  risk for CAD , nutrition counseling, prostate and colorectal cancer screening, and recommended immunizations.  Printed recommendations for health maintenance screenings was given.       Hypertriglyceridemia    Mild,  Will repeat in 6 months Lab Results  Component Value Date   CHOL 190 12/20/2015   HDL 46.80 12/20/2015   LDLDIRECT 103.0 12/20/2015   TRIG 277.0* 12/20/2015   CHOLHDL 4 12/20/2015         Atypical nevus    Referral to dermatology for evaluation of irregular nevi      Relevant Orders   Ambulatory referral to Dermatology    Other Visit Diagnoses    Hyperlipidemia        Relevant Orders    Lipid panel (Completed)    Other fatigue        Relevant Orders    Comprehensive  metabolic panel (Completed)    TSH (Completed)    CBC with Differential/Platelet (Completed)    Screening for prostate cancer        Relevant Orders    PSA (Completed)    Need for hepatitis C screening test        Relevant Orders    Hepatitis C antibody (Completed)       I am having Mr. Megan Salon start on traMADol. I am also having him maintain his VALTREX, ALPRAZolam, and zolpidem.  Meds ordered this encounter  Medications  . traMADol (ULTRAM) 50 MG tablet    Sig: Take 1 tablet (50 mg total) by mouth every 6 (six) hours as needed.    Dispense:  90 tablet    Refill:  0  . ALPRAZolam (XANAX) 1 MG tablet    Sig: TAKE ONE TABLET TWICE DAILY AS NEEDED FOR ANXIETY    Dispense:  30 tablet    Refill:  5    Keep on file for future refills  . zolpidem (AMBIEN) 10 MG tablet    Sig: Take 1 tablet (10 mg total) by mouth at bedtime as needed for sleep.    Dispense:  30 tablet    Refill:  5    Medications Discontinued During This Encounter  Medication Reason  . ALPRAZolam (XANAX) 1 MG tablet Reorder  . zolpidem (AMBIEN) 10 MG tablet Reorder    Follow-up: No Follow-up on file.   [name], MD

## 2015-12-21 LAB — CBC WITH DIFFERENTIAL/PLATELET
Basophils Absolute: 0.1 10*3/uL (ref 0.0–0.1)
Basophils Relative: 1.1 % (ref 0.0–3.0)
Eosinophils Absolute: 0.2 10*3/uL (ref 0.0–0.7)
Eosinophils Relative: 2.8 % (ref 0.0–5.0)
HCT: 46.6 % (ref 39.0–52.0)
Hemoglobin: 15.4 g/dL (ref 13.0–17.0)
Lymphocytes Relative: 30.3 % (ref 12.0–46.0)
Lymphs Abs: 2.2 10*3/uL (ref 0.7–4.0)
MCHC: 33.1 g/dL (ref 30.0–36.0)
MCV: 84.8 fl (ref 78.0–100.0)
Monocytes Absolute: 0.4 10*3/uL (ref 0.1–1.0)
Monocytes Relative: 5.3 % (ref 3.0–12.0)
Neutro Abs: 4.4 10*3/uL (ref 1.4–7.7)
Neutrophils Relative %: 60.5 % (ref 43.0–77.0)
Platelets: 232 10*3/uL (ref 150.0–400.0)
RBC: 5.49 Mil/uL (ref 4.22–5.81)
RDW: 13.9 % (ref 11.5–15.5)
WBC: 7.3 10*3/uL (ref 4.0–10.5)

## 2015-12-21 LAB — COMPREHENSIVE METABOLIC PANEL
ALT: 21 U/L (ref 0–53)
AST: 17 U/L (ref 0–37)
Albumin: 5 g/dL (ref 3.5–5.2)
Alkaline Phosphatase: 53 U/L (ref 39–117)
BUN: 14 mg/dL (ref 6–23)
CO2: 28 mEq/L (ref 19–32)
Calcium: 9.8 mg/dL (ref 8.4–10.5)
Chloride: 104 mEq/L (ref 96–112)
Creatinine, Ser: 1.01 mg/dL (ref 0.40–1.50)
GFR: 82.47 mL/min (ref >60.00)
Glucose, Bld: 92 mg/dL (ref 70–99)
Potassium: 3.9 mEq/L (ref 3.5–5.1)
Sodium: 141 mEq/L (ref 135–145)
Total Bilirubin: 0.5 mg/dL (ref 0.2–1.2)
Total Protein: 7.1 g/dL (ref 6.0–8.3)

## 2015-12-21 LAB — LIPID PANEL
Cholesterol: 190 mg/dL (ref 0–200)
HDL: 46.8 mg/dL (ref >39.00)
NonHDL: 143.28
Total CHOL/HDL Ratio: 4
Triglycerides: 277 mg/dL — ABNORMAL HIGH (ref 0.0–149.0)
VLDL: 55.4 mg/dL — ABNORMAL HIGH (ref 0.0–40.0)

## 2015-12-21 LAB — PSA: PSA: 0.78 ng/mL (ref 0.10–4.00)

## 2015-12-21 LAB — LDL CHOLESTEROL, DIRECT: Direct LDL: 103 mg/dL

## 2015-12-21 LAB — VITAMIN D 25 HYDROXY (VIT D DEFICIENCY, FRACTURES): VITD: 20.26 ng/mL — ABNORMAL LOW (ref 30.00–100.00)

## 2015-12-21 LAB — TSH: TSH: 1.38 u[IU]/mL (ref 0.35–4.50)

## 2015-12-21 NOTE — Progress Notes (Signed)
Cologuard ordered

## 2015-12-22 DIAGNOSIS — Z0000 Encounter for general adult medical examination without abnormal findings: Secondary | ICD-10-CM | POA: Insufficient documentation

## 2015-12-22 DIAGNOSIS — E781 Pure hyperglyceridemia: Secondary | ICD-10-CM | POA: Insufficient documentation

## 2015-12-22 DIAGNOSIS — E559 Vitamin D deficiency, unspecified: Secondary | ICD-10-CM | POA: Insufficient documentation

## 2015-12-22 DIAGNOSIS — D229 Melanocytic nevi, unspecified: Secondary | ICD-10-CM | POA: Insufficient documentation

## 2015-12-22 DIAGNOSIS — E782 Mixed hyperlipidemia: Secondary | ICD-10-CM | POA: Insufficient documentation

## 2015-12-22 LAB — HEPATITIS C ANTIBODY: HCV Ab: NEGATIVE

## 2015-12-22 NOTE — Assessment & Plan Note (Signed)

## 2015-12-22 NOTE — Assessment & Plan Note (Addendum)
Asked patient to suspend meloxicam for one week and  check BP at home over the next 4 weeks . Lab Results  Component Value Date   CREATININE 1.01 12/20/2015   Lab Results  Component Value Date   NA 141 12/20/2015   K 3.9 12/20/2015   CL 104 12/20/2015   CO2 28 12/20/2015

## 2015-12-22 NOTE — Assessment & Plan Note (Signed)
Referral to dermatology for evaluation of irregular nevi

## 2015-12-22 NOTE — Assessment & Plan Note (Signed)
Managed with ambien.  The risks and benefits of ambien use were reviewed with patient today including excessive sedation leading to respiratory depression,  impaired thinking/driving, and addiction.  Patient was advised to avoid concurrent use with alcohol, to use medication only as needed, not to exceed 10 mg and  and not to share with others. Refills given.    

## 2015-12-22 NOTE — Assessment & Plan Note (Signed)
Level is low at 20 .  Drisdol x 3 months

## 2015-12-22 NOTE — Assessment & Plan Note (Signed)
Mild,  Will repeat in 6 months Lab Results  Component Value Date   CHOL 190 12/20/2015   HDL 46.80 12/20/2015   LDLDIRECT 103.0 12/20/2015   TRIG 277.0* 12/20/2015   CHOLHDL 4 12/20/2015

## 2015-12-23 ENCOUNTER — Encounter: Payer: Self-pay | Admitting: *Deleted

## 2016-01-23 ENCOUNTER — Telehealth: Payer: Self-pay | Admitting: Internal Medicine

## 2016-01-23 MED ORDER — AMLODIPINE BESYLATE 2.5 MG PO TABS: 2.5000 mg | ORAL_TABLET | Freq: Every day | ORAL | Status: DC

## 2016-01-23 NOTE — Telephone Encounter (Signed)
Patient was called about a prescription request that was sent, but was clarified by him. He wanted to let Dr.Tullo know his blood pressure as been adveraging 114/90 for the last two weeks.

## 2016-01-23 NOTE — Telephone Encounter (Signed)
Patient notified and voiced understanding.

## 2016-01-23 NOTE — Telephone Encounter (Signed)
The diastolic (lower) number is still high.  I am recommending that he start taking amlodipine 2.5 mg daily .  Rd sent to pharmacy

## 2016-05-28 LAB — COLOGUARD: Cologuard: NEGATIVE

## 2016-06-12 ENCOUNTER — Encounter: Payer: Self-pay | Admitting: *Deleted

## 2016-06-12 ENCOUNTER — Telehealth: Payer: Self-pay | Admitting: Internal Medicine

## 2016-06-12 NOTE — Telephone Encounter (Signed)
Letter mailed with Normal results.

## 2016-06-12 NOTE — Telephone Encounter (Signed)
The results of your cologuard test were negative.   We can repeat this every 3 years  As an alternative to colonoscopy Until you are 60 for colon CA screening.

## 2016-06-19 ENCOUNTER — Encounter: Payer: Self-pay | Admitting: Internal Medicine

## 2016-07-09 ENCOUNTER — Other Ambulatory Visit: Payer: Self-pay

## 2016-07-09 MED ORDER — ALPRAZOLAM 1 MG PO TABS: ORAL_TABLET | ORAL | 5 refills | Status: DC

## 2016-07-09 MED ORDER — ALPRAZOLAM 1 MG PO TABS: ORAL_TABLET | ORAL | 0 refills | Status: DC

## 2016-07-09 NOTE — Telephone Encounter (Signed)
Refill for 30 days only.  OFFICE VISIT NEEDED prior to any more refills 

## 2016-07-09 NOTE — Telephone Encounter (Signed)
Refill request from [facility] for Xanax, last fill was 12/20/15 with 5 refills, LOV was 12/20/15, please review-Bryan Galloway, RMA

## 2016-07-09 NOTE — Telephone Encounter (Signed)
Patient advised, next available appointment is 08/13/16, patient scheduled. May need more refill before next appointment-aa

## 2016-08-13 ENCOUNTER — Encounter: Payer: Self-pay | Admitting: Internal Medicine

## 2016-08-13 ENCOUNTER — Encounter (INDEPENDENT_AMBULATORY_CARE_PROVIDER_SITE_OTHER): Payer: Self-pay

## 2016-08-13 ENCOUNTER — Ambulatory Visit (INDEPENDENT_AMBULATORY_CARE_PROVIDER_SITE_OTHER): Payer: BLUE CROSS/BLUE SHIELD | Admitting: Internal Medicine

## 2016-08-13 DIAGNOSIS — G8929 Other chronic pain: Secondary | ICD-10-CM

## 2016-08-13 DIAGNOSIS — G4700 Insomnia, unspecified: Secondary | ICD-10-CM | POA: Diagnosis not present

## 2016-08-13 DIAGNOSIS — E669 Obesity, unspecified: Secondary | ICD-10-CM

## 2016-08-13 DIAGNOSIS — M25569 Pain in unspecified knee: Secondary | ICD-10-CM

## 2016-08-13 DIAGNOSIS — I10 Essential (primary) hypertension: Secondary | ICD-10-CM | POA: Diagnosis not present

## 2016-08-13 MED ORDER — ALPRAZOLAM 1 MG PO TABS: 1.0000 mg | ORAL_TABLET | Freq: Every day | ORAL | 5 refills | Status: DC | PRN

## 2016-08-13 MED ORDER — TRAMADOL HCL 50 MG PO TABS: 50.0000 mg | ORAL_TABLET | Freq: Four times a day (QID) | ORAL | 5 refills | Status: DC | PRN

## 2016-08-13 NOTE — Progress Notes (Signed)
Pre visit review using our clinic review tool, if applicable. No additional management support is needed unless otherwise documented below in the visit note. 

## 2016-08-13 NOTE — Patient Instructions (Signed)
This is my most updated  "Low GI"  Diet:  It will allow you to lose 4 to 8  lbs  per month if you follow it carefully.  Your goal with exercise is a minimum of 30 minutes of aerobic exercise 5 days per week (Walking does not count once it becomes easy!)    All of the foods can be found at grocery stores and in bulk at [employer].  The Atkins protein bars and shakes are available in more varieties at Target, WalMart and [employer].     7 AM Breakfast:  Choose from the following:  Low carbohydrate Protein  Shakes (I recommend the  Premier Protein chocolate shakes,  EAS AdvantEdge "Carb Control" shakes  Or the Atkins shakes all are under 3 net carbs)     a scrambled egg/bacon/cheese burrito made with Mission's "carb balance" whole wheat tortilla  (about 10 net carbs )  Regulatory affairs officer (basically a quiche without the pastry crust) that is eaten cold and very convenient way to get your eggs.  8 carbs)  If you make your own protein shakes, avoid bananas and pineapple,  And use low carb greek yogurt or original /unsweetened almond or soy milk    Avoid cereal and bananas, oatmeal and cream of wheat and grits. They are loaded with carbohydrates!   10 AM: high protein snack:  Protein bar by Atkins (the snack size, under 200 cal, usually < 6 net carbs).    A stick of cheese:  Around 1 carb,  100 cal     Dannon Light n Fit [place] Yogurt  (80 cal, 8 carbs)  Other so called "protein bars" and Greek yogurts tend to be loaded with carbohydrates.  Remember, in food advertising, the word "energy" is synonymous for " carbohydrate."  Lunch:   A Sandwich using the bread choices listed, Can use any  Eggs,  lunchmeat, grilled meat or canned tuna), avocado, regular mayo/mustard  and cheese.  A Salad using blue cheese, ranch,  Goddess or vinagrette,  Avoid taco shells, croutons or "confetti" and no "candied nuts" but regular nuts OK.   No pretzels, nabs  or chips.  Pickles and miniature sweet  peppers are a good low carb alternative that provide a "crunch"  The bread is the only source of carbohydrate in a sandwich and  can be decreased by trying some of the attached alternatives to traditional loaf bread   Avoid "Low fat dressings, as well as [place] dressings They are loaded with sugar!   3 PM/ Mid day  Snack:  Consider  1 ounce of  almonds, walnuts, pistachios, pecans, peanuts,  Macadamia nuts or a nut medley.  Avoid "granola and granola bars "  Mixed nuts are ok in moderation as long as there are no raisins,  cranberries or dried fruit.   KIND bars are OK if you get the low glycemic index variety   Try the prosciutto/mozzarella cheese sticks by F[employer]  In deli /backery section   High protein      6 PM  Dinner:     Meat/fowl/fish with a green salad, and either broccoli, cauliflower, green beans, spinach, brussel sprouts or  Lima beans. DO NOT BREAD THE PROTEIN!!      There is a low carb pasta by Dreamfield's that is acceptable and tastes great: only 5 digestible carbs/serving.( All grocery stores but BJs carry it ) Several ready made meals are available low carb:  Try Michel Angelo's chicken piccata or chicken or eggplant parm over low carb pasta.(Lowes and BJs)   Marjory Lies Sanchez's "Carnitas" (pulled pork, no sauce,  0 carbs) or his beef pot roast to make a dinner burrito (at BJ's)  Pesto over low carb pasta (bj's sells a good quality pesto in the center refrigerated section of the deli   Try satueeing  Cheral Marker with mushroooms as a good side   [employer] Giant makes a mashed cauliflower that tastes like mashed potatoes  Whole wheat pasta is still full of digestible carbs and  Not as low in glycemic index as Dreamfield's.   Brown rice is still rice,  So skip the rice and noodles if you eat [place] or [place] (or at least limit to 1/2 cup)  9 PM snack :   Breyer's "low carb" fudgsicle or  ice cream bar (Carb Smart line), or  Weight Watcher's ice cream bar , or  another "no sugar added" ice cream;  a serving of fresh berries/cherries with whipped cream   Cheese or DANNON'S LlGHT N FIT GREEK YOGURT  8 ounces of [place] unsweetened almond/cococunut milk    Treat yourself to a parfait made with whipped cream blueberiies, walnuts and vanilla greek yogurt  Avoid bananas, pineapple, grapes  and watermelon on a regular basis because they are high in sugar.  THINK OF THEM AS DESSERT  Remember that snack Substitutions should be less than 10 NET carbs per serving and meals < 20 carbs. Remember to subtract fiber grams to get the "net carbs."

## 2016-08-13 NOTE — Progress Notes (Signed)
Subjective:  Patient ID: Bryan Galloway, male    DOB: 03/26/1966  Age: 50 y.o. MRN: [id number]  CC: Diagnoses of Knee pain, chronic, unspecified laterality, Essential hypertension, Obesity, and Insomnia were pertinent to this visit.  HPI Bryan Galloway presents for follow up on hypertension, DJD,  and insomnia managed with  Tramadol and zolpidem  Has gained 17 lbs since last visit .  BMI now 35 . Has not been weighing self at home.  Not walking or following a careful diet, but .  But plans to start a walking program after seeing today's weight.. 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     Using alprazolam for anxiety  As needed ,  Not more than 3/week,    Using tramadol prn knee pain.  No falls.      Outpatient Medications Prior to Visit  Medication Sig Dispense Refill  . amLODipine (NORVASC) 2.5 MG tablet Take 1 tablet (2.5 mg total) by mouth daily. 90 tablet 3  . VALTREX 1 G tablet Reported on 12/20/2015    . zolpidem (AMBIEN) 10 MG tablet Take 1 tablet (10 mg total) by mouth at bedtime as needed for sleep. 30 tablet 5  . ALPRAZolam (XANAX) 1 MG tablet TAKE ONE TABLET TWICE DAILY AS NEEDED FOR ANXIETY 30 tablet 0  . traMADol (ULTRAM) 50 MG tablet Take 1 tablet (50 mg total) by mouth every 6 (six) hours as needed. 90 tablet 0   No facility-administered medications prior to visit.     Review of Systems;  Patient denies headache, fevers, malaise, unintentional weight loss, skin rash, eye pain, sinus congestion and sinus pain, sore throat, dysphagia,  hemoptysis , cough, dyspnea, wheezing, chest pain, palpitations, orthopnea, edema, abdominal pain, nausea, melena, diarrhea, constipation, flank pain, dysuria, hematuria, urinary  Frequency, nocturia, numbness, tingling, seizures,  Focal weakness, Loss of consciousness,  Tremor, insomnia, depression, anxiety, and suicidal ideation.      Objective:  BP 120/74   Pulse 86   Temp 98.5 F (36.9 C) (Oral)   Ht 6\' 3"   (1.905 m)   Wt 281 lb (127.5 kg)   SpO2 95%   BMI 35.12 kg/m   BP Readings from Last 3 Encounters:  08/13/16 120/74  12/20/15 (!) 144/100  12/27/14 140/84    Wt Readings from Last 3 Encounters:  08/13/16 281 lb (127.5 kg)  12/20/15 264 lb 2 oz (119.8 kg)  12/27/14 279 lb 4 oz (126.7 kg)    General appearance: alert, cooperative and appears stated age Ears: normal TM's and external ear canals both ears Throat: lips, mucosa, and tongue normal; teeth and gums normal Neck: no adenopathy, no carotid bruit, supple, symmetrical, trachea midline and thyroid not enlarged, symmetric, no tenderness/mass/nodules Back: symmetric, no curvature. ROM normal. No CVA tenderness. Lungs: clear to auscultation bilaterally Heart: regular rate and rhythm, S1, S2 normal, no murmur, click, rub or gallop Abdomen: soft, non-tender; bowel sounds normal; no masses,  no organomegaly Pulses: 2+ and symmetric Skin: Skin color, texture, turgor normal. No rashes or lesions Lymph nodes: Cervical, supraclavicular, and axillary nodes normal.  No results found for: HGBA1C  Lab Results  Component Value Date   CREATININE 1.01 12/20/2015   CREATININE 1.07 12/27/2014   CREATININE 0.93 03/28/2012    Lab Results  Component Value Date   WBC 7.3 12/20/2015   HGB 15.4 12/20/2015   HCT 46.6 12/20/2015   PLT 232.0 12/20/2015   GLUCOSE 92 12/20/2015   CHOL 190 12/20/2015   TRIG  277.0 (H) 12/20/2015   HDL 46.80 12/20/2015   LDLDIRECT 103.0 12/20/2015   ALT 21 12/20/2015   AST 17 12/20/2015   NA 141 12/20/2015   K 3.9 12/20/2015   CL 104 12/20/2015   CREATININE 1.01 12/20/2015   BUN 14 12/20/2015   CO2 28 12/20/2015   TSH 1.38 12/20/2015   PSA 0.78 12/20/2015    No results found.  Assessment & Plan:   Problem List Items Addressed This Visit    Knee pain, chronic    Involving right knee greater than left. Some crepitus noted, without effusion . Marland Kitchen Advised to lose 20 lbs  And start exercising.  Tramadol  refilled.        Insomnia    Managed with Lorrin Mais.  The risks and benefits of ambien use were reviewed with patient today including excessive sedation leading to respiratory depression,  impaired thinking/driving, and addiction.  Patient was advised to avoid concurrent use with alcohol, to use medication only as needed, not to exceed 10 mg and  and not to share with others. Refills given.         Essential hypertension    Well controlled on current regimen based on today's reading . Renal function stable, no changes today.  Lab Results  Component Value Date   CREATININE 1.01 12/20/2015         Obesity    I have addressed  BMI and recommended a low glycemic index diet utilizing smaller more frequent meals to increase metabolism.  I have also recommended that patient start exercising with a goal of 30 minutes of aerobic exercise a minimum of 5 days per week. Screening for lipid disorders, thyroid and diabetes to be done  At next visit  Lab Results  Component Value Date   TSH 1.38 12/20/2015   No results found for: HGBA1C Lab Results  Component Value Date   CHOL 190 12/20/2015   HDL 46.80 12/20/2015   LDLDIRECT 103.0 12/20/2015   TRIG 277.0 (H) 12/20/2015   CHOLHDL 4 12/20/2015          Other Visit Diagnoses   None.    A total of 25 minutes of face to face time was spent with patient more than half of which was spent in counselling and coordination of care   I have changed Mr. Fitch's ALPRAZolam. I am also having him maintain his VALTREX, zolpidem, amLODipine, and traMADol.  Meds ordered this encounter  Medications  . ALPRAZolam (XANAX) 1 MG tablet    Sig: Take 1 tablet (1 mg total) by mouth daily as needed for anxiety. TAKE ONE TABLET TWICE DAILY AS NEEDED FOR ANXIETY    Dispense:  30 tablet    Refill:  5    KEEP ON FILE FOR FUTURE REFILLS  . traMADol (ULTRAM) 50 MG tablet    Sig: Take 1 tablet (50 mg total) by mouth every 6 (six) hours as needed.    Dispense:   90 tablet    Refill:  5    KEEP ON FILE FOR FUTURE REFILLS    Medications Discontinued During This Encounter  Medication Reason  . ALPRAZolam (XANAX) 1 MG tablet Reorder  . traMADol (ULTRAM) 50 MG tablet Reorder    Follow-up: Return in about 6 months (around 02/10/2017).   [name], MD

## 2016-08-14 DIAGNOSIS — E669 Obesity, unspecified: Secondary | ICD-10-CM

## 2016-08-14 NOTE — Assessment & Plan Note (Addendum)
Involving right knee greater than left. Some crepitus noted, without effusion . Marland Kitchen Advised to lose 20 lbs  And start exercising.  Tramadol refilled.

## 2016-08-14 NOTE — Assessment & Plan Note (Signed)
Managed with ambien.  The risks and benefits of ambien use were reviewed with patient today including excessive sedation leading to respiratory depression,  impaired thinking/driving, and addiction.  Patient was advised to avoid concurrent use with alcohol, to use medication only as needed, not to exceed 10 mg and  and not to share with others. Refills given.    

## 2016-08-14 NOTE — Assessment & Plan Note (Signed)
Well controlled on current regimen based on today's reading . Renal function stable, no changes today.  Lab Results  Component Value Date   CREATININE 1.01 12/20/2015

## 2016-08-14 NOTE — Assessment & Plan Note (Signed)
I have addressed  BMI and recommended a low glycemic index diet utilizing smaller more frequent meals to increase metabolism.  I have also recommended that patient start exercising with a goal of 30 minutes of aerobic exercise a minimum of 5 days per week. Screening for lipid disorders, thyroid and diabetes to be done  At next visit  Lab Results  Component Value Date   TSH 1.38 12/20/2015   No results found for: HGBA1C Lab Results  Component Value Date   CHOL 190 12/20/2015   HDL 46.80 12/20/2015   LDLDIRECT 103.0 12/20/2015   TRIG 277.0 (H) 12/20/2015   CHOLHDL 4 12/20/2015

## 2016-08-16 ENCOUNTER — Telehealth: Payer: Self-pay | Admitting: *Deleted

## 2016-08-16 NOTE — Telephone Encounter (Signed)
Faxed, thanks

## 2016-08-16 NOTE — Telephone Encounter (Signed)
Pt requested to have his last 3 PSA test results faxed over to [phone]

## 2016-09-25 ENCOUNTER — Other Ambulatory Visit: Payer: Self-pay | Admitting: Internal Medicine

## 2017-02-11 ENCOUNTER — Telehealth: Payer: Self-pay | Admitting: Internal Medicine

## 2017-02-11 ENCOUNTER — Encounter: Payer: Self-pay | Admitting: Internal Medicine

## 2017-02-11 ENCOUNTER — Ambulatory Visit (INDEPENDENT_AMBULATORY_CARE_PROVIDER_SITE_OTHER): Payer: BLUE CROSS/BLUE SHIELD | Admitting: Internal Medicine

## 2017-02-11 VITALS — BP 150/78 | HR 92 | Resp 16 | Ht 75.0 in | Wt 285.0 lb

## 2017-02-11 DIAGNOSIS — M25561 Pain in right knee: Secondary | ICD-10-CM | POA: Diagnosis not present

## 2017-02-11 DIAGNOSIS — Z0000 Encounter for general adult medical examination without abnormal findings: Secondary | ICD-10-CM

## 2017-02-11 DIAGNOSIS — E6609 Other obesity due to excess calories: Secondary | ICD-10-CM | POA: Diagnosis not present

## 2017-02-11 DIAGNOSIS — G8929 Other chronic pain: Secondary | ICD-10-CM

## 2017-02-11 DIAGNOSIS — E781 Pure hyperglyceridemia: Secondary | ICD-10-CM

## 2017-02-11 DIAGNOSIS — Z6837 Body mass index (BMI) 37.0-37.9, adult: Secondary | ICD-10-CM | POA: Diagnosis not present

## 2017-02-11 DIAGNOSIS — I10 Essential (primary) hypertension: Secondary | ICD-10-CM | POA: Diagnosis not present

## 2017-02-11 DIAGNOSIS — I499 Cardiac arrhythmia, unspecified: Secondary | ICD-10-CM | POA: Diagnosis not present

## 2017-02-11 DIAGNOSIS — M25562 Pain in left knee: Secondary | ICD-10-CM | POA: Diagnosis not present

## 2017-02-11 DIAGNOSIS — Z125 Encounter for screening for malignant neoplasm of prostate: Secondary | ICD-10-CM | POA: Diagnosis not present

## 2017-02-11 DIAGNOSIS — IMO0001 Reserved for inherently not codable concepts without codable children: Secondary | ICD-10-CM

## 2017-02-11 DIAGNOSIS — R7303 Prediabetes: Secondary | ICD-10-CM

## 2017-02-11 LAB — TSH: TSH: 1.07 u[IU]/mL (ref 0.35–4.50)

## 2017-02-11 LAB — MAGNESIUM: Magnesium: 2.1 mg/dL (ref 1.5–2.5)

## 2017-02-11 LAB — MICROALBUMIN / CREATININE URINE RATIO
Creatinine,U: 123.6 mg/dL
Microalb Creat Ratio: 1.6 mg/g (ref 0.0–30.0)
Microalb, Ur: 2 mg/dL — ABNORMAL HIGH (ref 0.0–1.9)

## 2017-02-11 LAB — LIPID PANEL
Cholesterol: 193 mg/dL (ref 0–200)
HDL: 44.1 mg/dL (ref >39.00)
NonHDL: 148.97
Total CHOL/HDL Ratio: 4
Triglycerides: 288 mg/dL — ABNORMAL HIGH (ref 0.0–149.0)
VLDL: 57.6 mg/dL — ABNORMAL HIGH (ref 0.0–40.0)

## 2017-02-11 LAB — PSA: PSA: 0.61 ng/mL (ref 0.10–4.00)

## 2017-02-11 LAB — COMPREHENSIVE METABOLIC PANEL
ALT: 21 U/L (ref 0–53)
AST: 15 U/L (ref 0–37)
Albumin: 4.6 g/dL (ref 3.5–5.2)
Alkaline Phosphatase: 60 U/L (ref 39–117)
BUN: 12 mg/dL (ref 6–23)
CO2: 26 mEq/L (ref 19–32)
Calcium: 9.7 mg/dL (ref 8.4–10.5)
Chloride: 106 mEq/L (ref 96–112)
Creatinine, Ser: 0.98 mg/dL (ref 0.40–1.50)
GFR: 85.01 mL/min (ref >60.00)
Glucose, Bld: 97 mg/dL (ref 70–99)
Potassium: 4.3 mEq/L (ref 3.5–5.1)
Sodium: 141 mEq/L (ref 135–145)
Total Bilirubin: 0.4 mg/dL (ref 0.2–1.2)
Total Protein: 6.7 g/dL (ref 6.0–8.3)

## 2017-02-11 LAB — HEMOGLOBIN A1C: Hgb A1c MFr Bld: 6.1 % (ref 4.6–6.5)

## 2017-02-11 LAB — LDL CHOLESTEROL, DIRECT: Direct LDL: 110 mg/dL

## 2017-02-11 MED ORDER — OMEPRAZOLE 20 MG PO CPDR: 20.0000 mg | DELAYED_RELEASE_CAPSULE | Freq: Every day | ORAL | 5 refills | Status: DC

## 2017-02-11 MED ORDER — AMLODIPINE BESYLATE 5 MG PO TABS: 2.5000 mg | ORAL_TABLET | Freq: Every day | ORAL | 1 refills | Status: DC

## 2017-02-11 NOTE — Telephone Encounter (Signed)
Added to pt's med list. Is it ok to send in rx?

## 2017-02-11 NOTE — Telephone Encounter (Signed)
Sent , Thanks

## 2017-02-11 NOTE — Telephone Encounter (Signed)
Pt forgot to mention while in the office that he has been taking OTC acid reflux medication, he would like it added to his medication list. Pt is taking omeprazole 20 mg 1x daily. He would like an rx for this sent to his pharmacy. Please advise, thank you!  [facility] [place]  Call pt @ [phone]

## 2017-02-11 NOTE — Patient Instructions (Addendum)
  The new goal for optimal blood pressure management is a resting blood pressure of  120/70.  Please increase your amlodipine to 5 mg daily and  reccheck your blood pressure after a  Week of taking the higher dose  to  See if you are at goal.     If your legs start to  swell on the higher dose, let me know .  We can either change the medication to something that does not have this side effect,  Or add a mild diuretic (HCTZ) to it   You had an ekg today because your rhythm was irregular

## 2017-02-11 NOTE — Progress Notes (Addendum)
Dictation #1 [id number]  [id number] Patient ID: Bryan Galloway, male    DOB: [birth year]  Age: 51 y.o. MRN: [id number]  The patient is here for annual examination and management of other chronic  problems.   The risk factors are reflected in the social history.  The roster of all physicians providing medical care to patient - is listed in the Snapshot section of the chart.  Activities of daily living:  The patient is 100% independent in all ADLs: dressing, toileting, feeding as well as independent mobility  Home safety : The patient has smoke detectors in the home. They wear seatbelts.  There are no firearms at home. There is no violence in the home.   There is no risks for hepatitis, STDs or HIV. There is no   history of blood transfusion. They have no travel history to infectious disease endemic areas of the world.  The patient has seen their dentist in the last six month. They have seen their eye doctor in the last year.  They do not  have excessive sun exposure. Discussed the need for sun protection: hats, long sleeves and use of sunscreen if there is significant sun exposure.   Diet: the importance of a healthy diet is discussed. They do have a healthy diet.  The benefits of regular aerobic exercise were discussed.  Depression screen: there are no signs or vegative symptoms of depression- irritability, change in appetite, anhedonia, sadness/tearfullness.  The following portions of the patient's history were reviewed and updated as appropriate: allergies, current medications, past family history, past medical history,  past surgical history, past social history  and problem list.  Visual acuity was not assessed per patient preference since she has regular follow up with her ophthalmologist. Hearing and body mass index were assessed and reviewed.   During the course of the visit the patient was educated and counseled about appropriate screening and preventive services including :  fall prevention , diabetes screening, nutrition counseling, colorectal cancer screening, and recommended immunizations.    CC: The primary encounter diagnosis was Visit for preventive health examination. Diagnoses of Hypertension, unspecified type, Hypertriglyceridemia, Class 2 obesity due to excess calories with serious comorbidity and body mass index (BMI) of 37.0 to 37.9 in adult, Cardiac arrhythmia, unspecified cardiac arrhythmia type, Prostate cancer screening, Essential hypertension, Chronic pain of both knees, and Prediabetes were also pertinent to this visit.  Hypertension:  Patient is taking his amlodipine as prescribed and notes no adverse effects.  Home BP readings have not been done regularly but are  generally >130/80 .  he is avoiding added salt in her diet and  Not exercising   Hypertriglyceridemia:  Last meal 5 am eggs and biscuit  Obesity:  Wt gain  4 lbs BMI 35.7 works 55 hours per week.  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 Pain is aggravated by activity mobic stopped due to  elevated blood pressures. Not using NSAIDs  Snoring more but not sleepy during day and no apnea reported    History Bryan Galloway has a past medical history of Nephrolithiasis.   He has a past surgical history that includes Joint replacement.   His family history includes Cancer (age of onset: 13) in his mother; Heart disease (age of onset: 29) in his father.He reports that he has never smoked. He has never used smokeless tobacco. He reports that he does not  drink alcohol or use drugs.  Outpatient Medications Prior to Visit  Medication Sig Dispense Refill  . ALPRAZolam (XANAX) 1 MG tablet Take 1 tablet (1 mg total) by mouth daily as needed for anxiety. TAKE ONE TABLET TWICE DAILY AS NEEDED FOR ANXIETY 30 tablet 5  . traMADol (ULTRAM) 50 MG tablet Take 1 tablet (50 mg total) by mouth every 6 (six) hours as  needed. 90 tablet 5  . zolpidem (AMBIEN) 10 MG tablet Take 1 tablet (10 mg total) by mouth at bedtime as needed for sleep. 30 tablet 5  . amLODipine (NORVASC) 2.5 MG tablet TAKE ONE TABLET EVERY DAY 90 tablet 1  . VALTREX 1 G tablet Reported on 12/20/2015     No facility-administered medications prior to visit.     Review of Systems   Patient denies headache, fevers, malaise, unintentional weight loss, skin rash, eye pain, sinus congestion and sinus pain, sore throat, dysphagia,  hemoptysis , cough, dyspnea, wheezing, chest pain, palpitations, orthopnea, edema, abdominal pain, nausea, melena, diarrhea, constipation, flank pain, dysuria, hematuria, urinary  Frequency, nocturia, numbness, tingling, seizures,  Focal weakness, Loss of consciousness,  Tremor, insomnia, depression, anxiety, and suicidal ideation.      Objective:  BP (!) 150/78 (BP Location: Left Arm, Patient Position: Sitting, Cuff Size: Large)   Pulse 92   Resp 16   Ht 6\' 3"  (1.905 m)   Wt 285 lb (129.3 kg)   SpO2 97%   BMI 35.62 kg/m   Physical Exam   General appearance: alert, cooperative and appears stated age Ears: normal TM's and external ear canals both ears Throat: lips, mucosa, and tongue normal; teeth and gums normal Neck: no adenopathy, no carotid bruit, supple, symmetrical, trachea midline and thyroid not enlarged, symmetric, no tenderness/mass/nodules Back: symmetric, no curvature. ROM normal. No CVA tenderness. Lungs: clear to auscultation bilaterally Heart: regularly irregular .  S1, S2 normal, no murmur, click, rub or gallop Abdomen: soft, non-tender; bowel sounds normal; no masses,  no organomegaly Pulses: 2+ and symmetric Skin: Skin color, texture, turgor normal. No rashes or lesions Lymph nodes: Cervical, supraclavicular, and axillary nodes normal.    Assessment & Plan:   Problem List Items Addressed This Visit    Arrhythmia    c/v exam noted  A reg irrreg rhythm so a 12 lead  ekg was done and  reviewed by e today .  .  NSR,  Normal intervals.          Relevant Medications   amLODipine (NORVASC) 5 MG tablet   Other Relevant Orders   EKG 12-Lead (Completed)   Comprehensive metabolic panel (Completed)   TSH (Completed)   Magnesium (Completed)   Essential hypertension    Not well controlled on current regimen. Renal function stable,will increase amlodipine to 5 mg daily .   Lab Results  Component Value Date   CREATININE 0.98 02/11/2017   Lab Results  Component Value Date   NA 141 02/11/2017   K 4.3 02/11/2017   CL 106 02/11/2017   CO2 26 02/11/2017         Relevant Medications   amLODipine (NORVASC) 5 MG tablet   Hypertriglyceridemia   Relevant Medications   amLODipine (NORVASC) 5 MG tablet   Other Relevant Orders   Lipid panel (Completed)   LDL cholesterol, direct (Completed)   Lipid panel   LDL cholesterol, direct   Knee pain, chronic    Involving right knee greater than left. Some crepitus noted, without effusion . Marland Kitchen Again encouraged  to lose 20 lbs  And start exercising.  Tramadol refilled.        Obesity    Screening labs indicated prediabetic state .I have addressed  BMI and recommended wt loss of 10% of body weight over the next 6 months using a low glycemic index diet and regular exercise a minimum of 5 days per week.   Lab Results  Component Value Date   HGBA1C 6.1 02/11/2017   Lab Results  Component Value Date   TSH 1.07 02/11/2017          Relevant Orders   Hemoglobin A1c (Completed)   Prediabetes    Addressed with phone call.  rtc 6 months       Relevant Orders   Hemoglobin A1c   Visit for preventive health examination - Primary    Annual comprehensive preventive exam was done as well as an evaluation and management of acute and chronic conditions .  During the course of the visit the patient was educated and counseled about appropriate screening and preventive services including :  diabetes screening, lipid analysis with projected   10 year  risk for CAD , nutrition counseling, prostate and colorectal cancer screening, and recommended immunizations.  Printed recommendations for health maintenance screenings was given.   Lab Results  Component Value Date   PSA 0.61 02/11/2017   PSA 0.78 12/20/2015   PSA 0.90 12/27/2014   cologuard negative 2016        Other Visit Diagnoses    Hypertension, unspecified type       Relevant Medications   amLODipine (NORVASC) 5 MG tablet   Other Relevant Orders   Microalbumin / creatinine urine ratio (Completed)   Comprehensive metabolic panel   Prostate cancer screening       Relevant Orders   PSA (Completed)      I have discontinued Mr. Hildebrant's VALTREX. I have also changed his amLODipine. Additionally, I am having him maintain his zolpidem, ALPRAZolam, and traMADol.  Meds ordered this encounter  Medications  . amLODipine (NORVASC) 5 MG tablet    Sig: Take 0.5 tablets (2.5 mg total) by mouth daily.    Dispense:  90 tablet    Refill:  1    FOR NEXT FILL. THANK YOU    Medications Discontinued During This Encounter  Medication Reason  . VALTREX 1 G tablet Therapy completed  . amLODipine (NORVASC) 2.5 MG tablet Reorder    Follow-up: No Follow-up on file.   [name], MD

## 2017-02-11 NOTE — Telephone Encounter (Signed)
Notified patient that rx was sent. thanks

## 2017-02-11 NOTE — Progress Notes (Signed)
Pre visit review using our clinic review tool, if applicable. No additional management support is needed unless otherwise documented below in the visit note. 

## 2017-02-12 DIAGNOSIS — I499 Cardiac arrhythmia, unspecified: Secondary | ICD-10-CM | POA: Insufficient documentation

## 2017-02-12 NOTE — Assessment & Plan Note (Signed)
Screening labs indicated prediabetic state .I have addressed  BMI and recommended wt loss of 10% of body weight over the next 6 months using a low glycemic index diet and regular exercise a minimum of 5 days per week.   Lab Results  Component Value Date   HGBA1C 6.1 02/11/2017   Lab Results  Component Value Date   TSH 1.07 02/11/2017

## 2017-02-12 NOTE — Assessment & Plan Note (Signed)
Not well controlled on current regimen. Renal function stable,will increase amlodipine to 5 mg daily .   Lab Results  Component Value Date   CREATININE 0.98 02/11/2017   Lab Results  Component Value Date   NA 141 02/11/2017   K 4.3 02/11/2017   CL 106 02/11/2017   CO2 26 02/11/2017

## 2017-02-12 NOTE — Assessment & Plan Note (Addendum)
Annual comprehensive preventive exam was done as well as an evaluation and management of acute and chronic conditions .  During the course of the visit the patient was educated and counseled about appropriate screening and preventive services including :  diabetes screening, lipid analysis with projected  10 year  risk for CAD , nutrition counseling, prostate and colorectal cancer screening, and recommended immunizations.  Printed recommendations for health maintenance screenings was given.   Lab Results  Component Value Date   PSA 0.61 02/11/2017   PSA 0.78 12/20/2015   PSA 0.90 12/27/2014   cologuard negative 2016

## 2017-02-12 NOTE — Assessment & Plan Note (Addendum)
Involving right knee greater than left. Some crepitus noted, without effusion . . Again encouraged to lose 20 lbs  And start exercising.  Tramadol refilled.   

## 2017-02-12 NOTE — Assessment & Plan Note (Signed)
c/v exam noted  A reg irrreg rhythm so a 12 lead  ekg was done and reviewed by e today .  .  NSR,  Normal intervals.

## 2017-02-13 DIAGNOSIS — R7303 Prediabetes: Secondary | ICD-10-CM | POA: Insufficient documentation

## 2017-02-13 NOTE — Addendum Note (Signed)
Addended by: [name] on: 02/13/2017 01:21 PM   Modules accepted: Orders

## 2017-02-13 NOTE — Assessment & Plan Note (Signed)
Addressed with phone call.  rtc 6 months

## 2017-02-15 ENCOUNTER — Other Ambulatory Visit: Payer: Self-pay | Admitting: Internal Medicine

## 2017-02-15 NOTE — Telephone Encounter (Signed)
Patient advised of below and verbalized understanding. Script faxed to [place]

## 2017-02-15 NOTE — Telephone Encounter (Signed)
[facility] REFILLED,  REMIND PATIENT THAT TRAMADOL REFILL WILL REQUIRED  6 MONTH FOLLOW UP AT APPROPRIATE TIME

## 2017-02-15 NOTE — Telephone Encounter (Signed)
Last filled 12/31/16 Last office visit 3/119/18 Next office visit 08/19/17

## 2017-02-16 ENCOUNTER — Other Ambulatory Visit: Payer: Self-pay | Admitting: Internal Medicine

## 2017-03-15 ENCOUNTER — Telehealth: Payer: Self-pay | Admitting: *Deleted

## 2017-03-15 MED ORDER — AMLODIPINE BESYLATE 10 MG PO TABS: 10.0000 mg | ORAL_TABLET | Freq: Every day | ORAL | 1 refills | Status: DC

## 2017-03-15 MED ORDER — LOSARTAN POTASSIUM 25 MG PO TABS: 25.0000 mg | ORAL_TABLET | Freq: Every day | ORAL | 0 refills | Status: DC

## 2017-03-15 NOTE — Telephone Encounter (Signed)
I will send amlodipine 10 mg  And add losartan 25 mg bc his bp is not at goal

## 2017-03-15 NOTE — Telephone Encounter (Signed)
Please advise 

## 2017-03-15 NOTE — Telephone Encounter (Signed)
Patient advised of below and verbalized an understanding  

## 2017-03-15 NOTE — Telephone Encounter (Signed)
Patient has reported blood pressure readings averaging around  148/93, he has requested his [facility] supply  Pt contact [phone]

## 2017-03-15 NOTE — Telephone Encounter (Signed)
Patient is currently taking two 5MG  tablets daily (10MG  Total). Please advise.

## 2017-03-15 NOTE — Telephone Encounter (Signed)
Please confirm what dose of amlodipine he is taking  Currently.

## 2017-03-22 ENCOUNTER — Other Ambulatory Visit: Payer: Self-pay | Admitting: Internal Medicine

## 2017-05-28 ENCOUNTER — Other Ambulatory Visit: Payer: Self-pay | Admitting: Internal Medicine

## 2017-06-10 ENCOUNTER — Telehealth: Payer: Self-pay | Admitting: *Deleted

## 2017-06-10 NOTE — Telephone Encounter (Signed)
Patient notified and understood dosage.

## 2017-06-10 NOTE — Telephone Encounter (Signed)
[facility] has requested a call to verify dosage in amlodipine dosage .

## 2017-06-10 NOTE — Telephone Encounter (Signed)
According to last office note on 02/11/2017 it looks like pt is taking amlodipine 5mg  1/2 tablet daily. Is that still the correct dose. According to a telephone note on 03/15/2017 the pt stated that he was taking two 5mg  tablets daily. Is the 2.5mg  or the 10mg  daily the correct dose?

## 2017-06-10 NOTE — Telephone Encounter (Signed)
Pt called and wanted to verify the dosage as well, to make sure that he is taking the right amount. Please advise, thank you!  Call pt @ [phone]

## 2017-06-10 NOTE — Telephone Encounter (Signed)
THE CORRECT DOSES ARE IN THE CHART:  Amlodipine 10 mg daily,  Losartan 25 mg daily

## 2017-08-15 ENCOUNTER — Other Ambulatory Visit (INDEPENDENT_AMBULATORY_CARE_PROVIDER_SITE_OTHER): Payer: 59

## 2017-08-15 DIAGNOSIS — R7303 Prediabetes: Secondary | ICD-10-CM | POA: Diagnosis not present

## 2017-08-15 DIAGNOSIS — E781 Pure hyperglyceridemia: Secondary | ICD-10-CM

## 2017-08-15 DIAGNOSIS — I10 Essential (primary) hypertension: Secondary | ICD-10-CM

## 2017-08-15 LAB — LIPID PANEL
Cholesterol: 188 mg/dL (ref 0–200)
HDL: 51.3 mg/dL (ref >39.00)
LDL Cholesterol: 99 mg/dL (ref 0–99)
NonHDL: 136.76
Total CHOL/HDL Ratio: 4
Triglycerides: 191 mg/dL — ABNORMAL HIGH (ref 0.0–149.0)
VLDL: 38.2 mg/dL (ref 0.0–40.0)

## 2017-08-15 LAB — COMPREHENSIVE METABOLIC PANEL
ALT: 19 U/L (ref 0–53)
AST: 15 U/L (ref 0–37)
Albumin: 4.4 g/dL (ref 3.5–5.2)
Alkaline Phosphatase: 52 U/L (ref 39–117)
BUN: 15 mg/dL (ref 6–23)
CO2: 27 mEq/L (ref 19–32)
Calcium: 9.6 mg/dL (ref 8.4–10.5)
Chloride: 105 mEq/L (ref 96–112)
Creatinine, Ser: 1.08 mg/dL (ref 0.40–1.50)
GFR: 75.85 mL/min (ref >60.00)
Glucose, Bld: 114 mg/dL — ABNORMAL HIGH (ref 70–99)
Potassium: 4.2 mEq/L (ref 3.5–5.1)
Sodium: 139 mEq/L (ref 135–145)
Total Bilirubin: 0.4 mg/dL (ref 0.2–1.2)
Total Protein: 6.6 g/dL (ref 6.0–8.3)

## 2017-08-15 LAB — HEMOGLOBIN A1C: Hgb A1c MFr Bld: 6.2 % (ref 4.6–6.5)

## 2017-08-15 LAB — LDL CHOLESTEROL, DIRECT: Direct LDL: 103 mg/dL

## 2017-08-19 ENCOUNTER — Ambulatory Visit: Payer: BLUE CROSS/BLUE SHIELD | Admitting: Internal Medicine

## 2017-08-20 ENCOUNTER — Encounter: Payer: Self-pay | Admitting: Internal Medicine

## 2017-08-20 ENCOUNTER — Ambulatory Visit (INDEPENDENT_AMBULATORY_CARE_PROVIDER_SITE_OTHER): Payer: 59 | Admitting: Internal Medicine

## 2017-08-20 VITALS — BP 122/80 | HR 98 | Temp 98.1°F | Resp 16 | Ht 75.0 in | Wt 286.6 lb

## 2017-08-20 DIAGNOSIS — E559 Vitamin D deficiency, unspecified: Secondary | ICD-10-CM | POA: Diagnosis not present

## 2017-08-20 DIAGNOSIS — E538 Deficiency of other specified B group vitamins: Secondary | ICD-10-CM | POA: Diagnosis not present

## 2017-08-20 DIAGNOSIS — R7303 Prediabetes: Secondary | ICD-10-CM | POA: Diagnosis not present

## 2017-08-20 DIAGNOSIS — E781 Pure hyperglyceridemia: Secondary | ICD-10-CM | POA: Diagnosis not present

## 2017-08-20 DIAGNOSIS — G629 Polyneuropathy, unspecified: Secondary | ICD-10-CM

## 2017-08-20 DIAGNOSIS — I10 Essential (primary) hypertension: Secondary | ICD-10-CM | POA: Diagnosis not present

## 2017-08-20 DIAGNOSIS — Z23 Encounter for immunization: Secondary | ICD-10-CM

## 2017-08-20 LAB — VITAMIN B12: Vitamin B-12: 246 pg/mL (ref 211–911)

## 2017-08-20 LAB — VITAMIN D 25 HYDROXY (VIT D DEFICIENCY, FRACTURES): VITD: 17.62 ng/mL — ABNORMAL LOW (ref 30.00–100.00)

## 2017-08-20 MED ORDER — ALPRAZOLAM 1 MG PO TABS: 1.0000 mg | ORAL_TABLET | Freq: Two times a day (BID) | ORAL | 5 refills | Status: DC | PRN

## 2017-08-20 MED ORDER — ZOLPIDEM TARTRATE 10 MG PO TABS: 10.0000 mg | ORAL_TABLET | Freq: Every evening | ORAL | 5 refills | Status: DC | PRN

## 2017-08-20 NOTE — Progress Notes (Addendum)
Subjective:  Patient ID: Bryan Galloway, male    DOB: 1966-04-04  Age: 51 y.o. MRN: [id number]  CC: The primary encounter diagnosis was Neuropathy. Diagnoses of Need for immunization against influenza, Vitamin D deficiency, Prediabetes, Essential hypertension, Hypertriglyceridemia, and B12 deficiency were also pertinent to this visit.  HPI Bryan Galloway presents for 6 month follow up on hypertension , chronic insomnia and obesity .  Patient is taking his medications as prescribed and notes no adverse effects.  Home BP readings have been done about once per week and are  generally < 130/80 .  he is avoiding added salt in her diet and walking regularly about 3 times per week for exercise  .  Has developed Bilateral neuropathy described as mild tingling bottom of feet  for the last 2 months,  Bottom of feet.  No change with foot elevation .   No daily hip pain since hip replacement.s     Lost ten lbs and regained it   cologuard  Negative last year ,  No FH.    Needs 90 day refills on medications   Lab Results  Component Value Date   PSA 0.61 02/11/2017   PSA 0.78 12/20/2015   PSA 0.90 12/27/2014      Taking tramadol for knee pain prn.   Outpatient Medications Prior to Visit  Medication Sig Dispense Refill  . amLODipine (NORVASC) 10 MG tablet Take 1 tablet (10 mg total) by mouth daily. 90 tablet 1  . losartan (COZAAR) 25 MG tablet TAKE 1 TABLET BY MOUTH DAILY 90 tablet 1  . omeprazole (PRILOSEC) 20 MG capsule TAKE 1 CAPSULE EVERY DAY 90 capsule 0  . traMADol (ULTRAM) 50 MG tablet TAKE ONE TABLET BY MOUTH EVERY 6 HOURS AS NEEDED 90 tablet 3  . ALPRAZolam (XANAX) 1 MG tablet TAKE ONE TABLET BY MOUTH TWICE DAILY AS NEEDED FOR ANXIETY 30 tablet 5  . zolpidem (AMBIEN) 10 MG tablet Take 1 tablet (10 mg total) by mouth at bedtime as needed for sleep. 30 tablet 5   No facility-administered medications prior to visit.     Review of Systems;  Patient denies headache, fevers,  malaise, unintentional weight loss, skin rash, eye pain, sinus congestion and sinus pain, sore throat, dysphagia,  hemoptysis , cough, dyspnea, wheezing, chest pain, palpitations, orthopnea, edema, abdominal pain, nausea, melena, diarrhea, constipation, flank pain, dysuria, hematuria, urinary  Frequency, nocturia, numbness, tingling, seizures,  Focal weakness, Loss of consciousness,  Tremor, insomnia, depression, anxiety, and suicidal ideation.      Objective:  BP 122/80 (BP Location: Left Arm, Patient Position: Sitting, Cuff Size: Large)   Pulse 98   Temp 98.1 F (36.7 C) (Oral)   Resp 16   Ht 6\' 3"  (1.905 m)   Wt 286 lb 9.6 oz (130 kg)   SpO2 96%   BMI 35.82 kg/m   BP Readings from Last 3 Encounters:  08/20/17 122/80  02/11/17 (!) 150/78  08/13/16 120/74    Wt Readings from Last 3 Encounters:  08/20/17 286 lb 9.6 oz (130 kg)  02/11/17 285 lb (129.3 kg)  08/13/16 281 lb (127.5 kg)    General appearance: alert, cooperative and appears stated age Ears: normal TM's and external ear canals both ears Throat: lips, mucosa, and tongue normal; teeth and gums normal Neck: no adenopathy, no carotid bruit, supple, symmetrical, trachea midline and thyroid not enlarged, symmetric, no tenderness/mass/nodules Back: symmetric, no curvature. ROM normal. No CVA tenderness. Lungs: clear to auscultation bilaterally Heart: regular  rate and rhythm, S1, S2 normal, no murmur, click, rub or gallop Abdomen: soft, non-tender; bowel sounds normal; no masses,  no organomegaly Pulses: 2+ and symmetric Skin: Skin color, texture, turgor normal. No rashes or lesions Lymph nodes: Cervical, supraclavicular, and axillary nodes normal.  Lab Results  Component Value Date   HGBA1C 6.2 08/15/2017   HGBA1C 6.1 02/11/2017    Lab Results  Component Value Date   CREATININE 1.08 08/15/2017   CREATININE 0.98 02/11/2017   CREATININE 1.01 12/20/2015    Lab Results  Component Value Date   WBC 7.3 12/20/2015     HGB 15.4 12/20/2015   HCT 46.6 12/20/2015   PLT 232.0 12/20/2015   GLUCOSE 114 (H) 08/15/2017   CHOL 188 08/15/2017   TRIG 191.0 (H) 08/15/2017   HDL 51.30 08/15/2017   LDLDIRECT 103.0 08/15/2017   LDLCALC 99 08/15/2017   ALT 19 08/15/2017   AST 15 08/15/2017   NA 139 08/15/2017   K 4.2 08/15/2017   CL 105 08/15/2017   CREATININE 1.08 08/15/2017   BUN 15 08/15/2017   CO2 27 08/15/2017   TSH 1.07 02/11/2017   PSA 0.61 02/11/2017   HGBA1C 6.2 08/15/2017   MICROALBUR 2.0 (H) 02/11/2017    No results found.  Assessment & Plan:   Problem List Items Addressed This Visit    B12 deficiency    May be due to use of PPI,.  Checking folate and intrinsic factor ab.  rn visits for 3 weekly injections needed       Essential hypertension    Well controlled on current regimen. Renal function stable, no changes today.  Lab Results  Component Value Date   CREATININE 1.08 08/15/2017   Lab Results  Component Value Date   NA 139 08/15/2017   K 4.2 08/15/2017   CL 105 08/15/2017   CO2 27 08/15/2017         Hypertriglyceridemia    Mild,  Managed with diet,  Lab Results  Component Value Date   CHOL 188 08/15/2017   HDL 51.30 08/15/2017   LDLCALC 99 08/15/2017   LDLDIRECT 103.0 08/15/2017   TRIG 191.0 (H) 08/15/2017   CHOLHDL 4 08/15/2017         Prediabetes    I have addressed  BMI and recommended a low glycemic index diet utilizing smaller more frequent meals to increase metabolism.  I have also recommended that patient start exercising with a goal of 30 minutes of aerobic exercise a minimum of 5 days per week.   Lab Results  Component Value Date   HGBA1C 6.2 08/15/2017         Vitamin D deficiency    Recurrent.  Drisdol eeekly x 6 months       Relevant Orders   VITAMIN D 25 Hydroxy (Vit-D Deficiency, Fractures) (Completed)    Other Visit Diagnoses    Neuropathy    -  Primary   Relevant Orders   Vitamin B12 (Completed)   RPR (Completed)   HIV antibody  (Completed)   Need for immunization against influenza       Relevant Orders   Flu Vaccine QUAD 36+ mos IM (Completed)      I have changed Mr. Bryan Galloway's ALPRAZolam. I am also having him maintain his traMADol, amLODipine, losartan, omeprazole, and zolpidem.  Meds ordered this encounter  Medications  . ALPRAZolam (XANAX) 1 MG tablet    Sig: Take 1 tablet (1 mg total) by mouth 2 (two) times daily as needed. for  anxiety    Dispense:  30 tablet    Refill:  5  . zolpidem (AMBIEN) 10 MG tablet    Sig: Take 1 tablet (10 mg total) by mouth at bedtime as needed for sleep.    Dispense:  30 tablet    Refill:  5   A total of 25 minutes of face to face time was spent with patient more than half of which was spent in counselling about the above mentioned conditions  and coordination of care   Medications Discontinued During This Encounter  Medication Reason  . ALPRAZolam (XANAX) 1 MG tablet Reorder  . zolpidem (AMBIEN) 10 MG tablet Reorder    Follow-up: No Follow-up on file.   [name], MD

## 2017-08-20 NOTE — Patient Instructions (Signed)
Peripheral Neuropathy Peripheral neuropathy is a type of nerve damage. It affects nerves that carry signals between the spinal cord and other parts of the body. These are called peripheral nerves. With peripheral neuropathy, one nerve or a group of nerves may be damaged. What are the causes? Many things can damage peripheral nerves. For some people with peripheral neuropathy, the cause is unknown. Some causes include:  Diabetes. This is the most common cause of peripheral neuropathy.  Injury to a nerve.  Pressure or stress on a nerve that lasts a long time.  Too little vitamin B. Alcoholism can lead to this.  Infections.  Autoimmune diseases, such as multiple sclerosis and systemic lupus erythematosus.  Inherited nerve diseases.  Some medicines, such as cancer drugs.  Toxic substances, such as lead and mercury.  Too little blood flowing to the legs.  Kidney disease.  Thyroid disease.  What are the signs or symptoms? Different people have different symptoms. The symptoms you have will depend on which of your nerves is damaged. Common symptoms include:  Loss of feeling (numbness) in the feet and hands.  Tingling in the feet and hands.  Pain that burns.  Very sensitive skin.  Weakness.  Not being able to move a part of the body (paralysis).  Muscle twitching.  Clumsiness or poor coordination.  Loss of balance.  Not being able to control your bladder.  Feeling dizzy.  Sexual problems.  How is this diagnosed? Peripheral neuropathy is a symptom, not a disease. Finding the cause of peripheral neuropathy can be hard. To figure that out, your health care provider will take a medical history and do a physical exam. A neurological exam will also be done. This involves checking things affected by your brain, spinal cord, and nerves (nervous system). For example, your health care provider will check your reflexes, how you move, and what you can feel. Other types of tests  may also be ordered, such as:  Blood tests.  A test of the fluid in your spinal cord.  Imaging tests, such as CT scans or an MRI.  Electromyography (EMG). This test checks the nerves that control muscles.  Nerve conduction velocity tests. These tests check how fast messages pass through your nerves.  Nerve biopsy. A small piece of nerve is removed. It is then checked under a microscope.  How is this treated?  Medicine is often used to treat peripheral neuropathy. Medicines may include: ? Pain-relieving medicines. Prescription or over-the-counter medicine may be suggested. ? Antiseizure medicine. This may be used for pain. ? Antidepressants. These also may help ease pain from neuropathy. ? Lidocaine. This is a numbing medicine. You might wear a patch or be given a shot. ? Mexiletine. This medicine is typically used to help control irregular heart rhythms.  Surgery. Surgery may be needed to relieve pressure on a nerve or to destroy a nerve that is causing pain.  Physical therapy to help movement.  Assistive devices to help movement. Follow these instructions at home:  Only take over-the-counter or prescription medicines as directed by your health care provider. Follow the instructions carefully for any given medicines. Do not take any other medicines without first getting approval from your health care provider.  If you have diabetes, work closely with your health care provider to keep your blood sugar under control.  If you have numbness in your feet: ? Check every day for signs of injury or infection. Watch for redness, warmth, and swelling. ? Wear padded socks and comfortable   shoes. These help protect your feet.  Do not do things that put pressure on your damaged nerve.  Do not smoke. Smoking keeps blood from getting to damaged nerves.  Avoid or limit alcohol. Too much alcohol can cause a lack of B vitamins. These vitamins are needed for healthy nerves.  Develop a good  support system. Coping with peripheral neuropathy can be stressful. Talk to a mental health specialist or join a support group if you are struggling.  Follow up with your health care provider as directed. Contact a health care provider if:  You have new signs or symptoms of peripheral neuropathy.  You are struggling emotionally from dealing with peripheral neuropathy.  You have a fever. Get help right away if:  You have an injury or infection that is not healing.  You feel very dizzy or begin vomiting.  You have chest pain.  You have trouble breathing. This information is not intended to replace advice given to you by your health care provider. Make sure you discuss any questions you have with your health care provider. Document Released: 11/02/2002 Document Revised: 04/19/2016 Document Reviewed: 07/20/2013 Elsevier Interactive Patient Education  2017 [employer]  

## 2017-08-21 LAB — HIV ANTIBODY (ROUTINE TESTING W REFLEX): HIV 1&2 Ab, 4th Generation: NONREACTIVE

## 2017-08-21 LAB — RPR: RPR Ser Ql: NONREACTIVE

## 2017-08-21 NOTE — Assessment & Plan Note (Signed)
Mild,  Managed with diet,  Lab Results  Component Value Date   CHOL 188 08/15/2017   HDL 51.30 08/15/2017   LDLCALC 99 08/15/2017   LDLDIRECT 103.0 08/15/2017   TRIG 191.0 (H) 08/15/2017   CHOLHDL 4 08/15/2017

## 2017-08-21 NOTE — Assessment & Plan Note (Signed)
I have addressed  BMI and recommended a low glycemic index diet utilizing smaller more frequent meals to increase metabolism.  I have also recommended that patient start exercising with a goal of 30 minutes of aerobic exercise a minimum of 5 days per week.   Lab Results  Component Value Date   HGBA1C 6.2 08/15/2017

## 2017-08-21 NOTE — Assessment & Plan Note (Signed)
Well controlled on current regimen. Renal function stable, no changes today.  Lab Results  Component Value Date   CREATININE 1.08 08/15/2017   Lab Results  Component Value Date   NA 139 08/15/2017   K 4.2 08/15/2017   CL 105 08/15/2017   CO2 27 08/15/2017

## 2017-08-22 ENCOUNTER — Encounter: Payer: Self-pay | Admitting: Internal Medicine

## 2017-08-22 ENCOUNTER — Other Ambulatory Visit: Payer: Self-pay | Admitting: Internal Medicine

## 2017-08-22 DIAGNOSIS — E538 Deficiency of other specified B group vitamins: Secondary | ICD-10-CM | POA: Insufficient documentation

## 2017-08-22 MED ORDER — ERGOCALCIFEROL 1.25 MG (50000 UT) PO CAPS: 50000.0000 [IU] | ORAL_CAPSULE | ORAL | 5 refills | Status: DC

## 2017-08-22 NOTE — Assessment & Plan Note (Signed)
Recurrent.  Drisdol eeekly x 6 months

## 2017-08-22 NOTE — Assessment & Plan Note (Signed)
May be due to use of PPI,.  Checking folate and intrinsic factor ab.  rn visits for 3 weekly injections needed

## 2017-08-22 NOTE — Progress Notes (Signed)
flic aci

## 2017-08-23 ENCOUNTER — Other Ambulatory Visit: Payer: Self-pay

## 2017-08-23 MED ORDER — OMEPRAZOLE 20 MG PO CPDR: 20.0000 mg | DELAYED_RELEASE_CAPSULE | Freq: Every day | ORAL | 0 refills | Status: DC

## 2017-08-23 MED ORDER — AMLODIPINE BESYLATE 10 MG PO TABS: 10.0000 mg | ORAL_TABLET | Freq: Every day | ORAL | 1 refills | Status: DC

## 2017-08-23 MED ORDER — ERGOCALCIFEROL 1.25 MG (50000 UT) PO CAPS: 50000.0000 [IU] | ORAL_CAPSULE | ORAL | 1 refills | Status: DC

## 2017-08-23 MED ORDER — LOSARTAN POTASSIUM 25 MG PO TABS
25.0000 mg | ORAL_TABLET | Freq: Every day | ORAL | 1 refills | Status: DC
Start: 2017-08-23 — End: 2017-09-18

## 2017-08-23 MED ORDER — TRAMADOL HCL 50 MG PO TABS: 50.0000 mg | ORAL_TABLET | Freq: Four times a day (QID) | ORAL | 3 refills | Status: DC | PRN

## 2017-08-23 NOTE — Telephone Encounter (Signed)
Last office visit 08/20/17 Next office visit 09/06/17

## 2017-08-23 NOTE — Telephone Encounter (Deleted)
Patient calls for refill requesting scripts be sent to [facility] for insurance purposes ,instead of [facility].

## 2017-08-23 NOTE — Telephone Encounter (Signed)
rx has been printed, signed and faxed.  

## 2017-08-23 NOTE — Telephone Encounter (Signed)
Last office visit 08/20/17 Next office visit 08/27/17

## 2017-08-27 ENCOUNTER — Other Ambulatory Visit (INDEPENDENT_AMBULATORY_CARE_PROVIDER_SITE_OTHER): Payer: 59

## 2017-08-27 ENCOUNTER — Ambulatory Visit (INDEPENDENT_AMBULATORY_CARE_PROVIDER_SITE_OTHER): Payer: 59

## 2017-08-27 DIAGNOSIS — E538 Deficiency of other specified B group vitamins: Secondary | ICD-10-CM

## 2017-08-27 MED ORDER — CYANOCOBALAMIN 1000 MCG/ML IJ SOLN
1000.0000 ug | Freq: Once | INTRAMUSCULAR | Status: AC
  Administered 2017-08-27: 1000 ug via INTRAMUSCULAR

## 2017-08-27 NOTE — Progress Notes (Signed)
Patient presents for 1 st weekly B 12 injection.  Injected left deltoid   Patient tolerated injection well.     Reviewed.  Dr Nicki Reaper

## 2017-08-29 LAB — INTRINSIC FACTOR ANTIBODIES: Intrinsic Factor: NEGATIVE

## 2017-08-29 LAB — FOLATE RBC: RBC Folate: 648 ng/mL RBC (ref >280)

## 2017-09-03 ENCOUNTER — Ambulatory Visit (INDEPENDENT_AMBULATORY_CARE_PROVIDER_SITE_OTHER): Payer: 59

## 2017-09-03 DIAGNOSIS — E538 Deficiency of other specified B group vitamins: Secondary | ICD-10-CM

## 2017-09-03 MED ORDER — CYANOCOBALAMIN 1000 MCG/ML IJ SOLN
1000.0000 ug | Freq: Once | INTRAMUSCULAR | Status: AC
  Administered 2017-09-03: 1000 ug via INTRAMUSCULAR

## 2017-09-03 NOTE — Progress Notes (Addendum)
Patient comes in for second weekly  B 12 injection.  Injected right deltoid.  Patient tolerated injection well.    I have reviewed the above information and agree with above.   Deborra Medina, MD

## 2017-09-10 ENCOUNTER — Other Ambulatory Visit: Payer: Self-pay | Admitting: Internal Medicine

## 2017-09-10 ENCOUNTER — Ambulatory Visit (INDEPENDENT_AMBULATORY_CARE_PROVIDER_SITE_OTHER): Payer: 59 | Admitting: *Deleted

## 2017-09-10 DIAGNOSIS — E538 Deficiency of other specified B group vitamins: Secondary | ICD-10-CM

## 2017-09-10 MED ORDER — CYANOCOBALAMIN 1000 MCG/ML IJ SOLN
1000.0000 ug | Freq: Once | INTRAMUSCULAR | Status: AC
  Administered 2017-09-10: 1000 ug via INTRAMUSCULAR

## 2017-09-10 NOTE — Progress Notes (Signed)
Patient presented for B 12 injection to left deltoid, patient voiced no concerns nor showed any signs of distress during injection. 

## 2017-09-12 NOTE — Progress Notes (Signed)
  I have reviewed the above information and agree with above.   Josiah Nieto, MD 

## 2017-09-17 ENCOUNTER — Ambulatory Visit (INDEPENDENT_AMBULATORY_CARE_PROVIDER_SITE_OTHER): Payer: 59 | Admitting: *Deleted

## 2017-09-17 DIAGNOSIS — E538 Deficiency of other specified B group vitamins: Secondary | ICD-10-CM

## 2017-09-17 DIAGNOSIS — R300 Dysuria: Secondary | ICD-10-CM | POA: Diagnosis not present

## 2017-09-17 LAB — POCT URINALYSIS DIPSTICK
Bilirubin, UA: NEGATIVE
Blood, UA: NEGATIVE
Glucose, UA: NEGATIVE
Ketones, UA: NEGATIVE
Leukocytes, UA: NEGATIVE
Nitrite, UA: NEGATIVE
Protein, UA: NEGATIVE
Spec Grav, UA: 1.025 (ref 1.010–1.025)
Urobilinogen, UA: 0.2 E.U./dL
pH, UA: 5.5 (ref 5.0–8.0)

## 2017-09-17 LAB — URINALYSIS, MICROSCOPIC ONLY: RBC / HPF: NONE SEEN (ref 0–?)

## 2017-09-17 MED ORDER — CYANOCOBALAMIN 1000 MCG/ML IJ SOLN
1000.0000 ug | Freq: Once | INTRAMUSCULAR | Status: AC
  Administered 2019-01-20: 1000 ug via INTRAMUSCULAR

## 2017-09-17 NOTE — Progress Notes (Signed)
Patient presented for B 12 injection to right deltoid, patient voiced no concerns nor showed any signs of distress during injection. 

## 2017-09-18 ENCOUNTER — Encounter: Payer: Self-pay | Admitting: Internal Medicine

## 2017-09-18 ENCOUNTER — Ambulatory Visit (INDEPENDENT_AMBULATORY_CARE_PROVIDER_SITE_OTHER): Payer: 59 | Admitting: Internal Medicine

## 2017-09-18 VITALS — BP 112/82 | HR 88 | Temp 98.6°F | Resp 15 | Ht 75.0 in | Wt 285.8 lb

## 2017-09-18 DIAGNOSIS — N410 Acute prostatitis: Secondary | ICD-10-CM | POA: Diagnosis not present

## 2017-09-18 DIAGNOSIS — R350 Frequency of micturition: Secondary | ICD-10-CM | POA: Diagnosis not present

## 2017-09-18 DIAGNOSIS — R1032 Left lower quadrant pain: Secondary | ICD-10-CM

## 2017-09-18 LAB — URINE CULTURE
MICRO NUMBER:: 81184584
Result:: NO GROWTH
SPECIMEN QUALITY:: ADEQUATE

## 2017-09-18 LAB — CBC WITH DIFFERENTIAL/PLATELET
Basophils Absolute: 0.1 10*3/uL (ref 0.0–0.1)
Basophils Relative: 0.9 % (ref 0.0–3.0)
Eosinophils Absolute: 0.1 10*3/uL (ref 0.0–0.7)
Eosinophils Relative: 1.7 % (ref 0.0–5.0)
HCT: 44.8 % (ref 39.0–52.0)
Hemoglobin: 14.7 g/dL (ref 13.0–17.0)
Lymphocytes Relative: 32.3 % (ref 12.0–46.0)
Lymphs Abs: 2 10*3/uL (ref 0.7–4.0)
MCHC: 32.9 g/dL (ref 30.0–36.0)
MCV: 86.4 fl (ref 78.0–100.0)
Monocytes Absolute: 0.5 10*3/uL (ref 0.1–1.0)
Monocytes Relative: 7.8 % (ref 3.0–12.0)
Neutro Abs: 3.5 10*3/uL (ref 1.4–7.7)
Neutrophils Relative %: 57.3 % (ref 43.0–77.0)
Platelets: 239 10*3/uL (ref 150.0–400.0)
RBC: 5.18 Mil/uL (ref 4.22–5.81)
RDW: 13.9 % (ref 11.5–15.5)
WBC: 6.1 10*3/uL (ref 4.0–10.5)

## 2017-09-18 LAB — SEDIMENTATION RATE: Sed Rate: 2 mm/hr (ref 0–20)

## 2017-09-18 LAB — PSA: PSA: 0.69 ng/mL (ref 0.10–4.00)

## 2017-09-18 NOTE — Assessment & Plan Note (Addendum)
Voiding small amounts,  Frequently ,  For the past 4 weeks,  After an episode of low back pain on the left side reminiscent of stone. PSA normal ,  UA normal.  CT scan ordered  To evaluate for persistent stones,  BPD and hydronephrosis

## 2017-09-18 NOTE — Progress Notes (Signed)
Subjective:  Patient ID: Bryan Galloway, male    DOB: 1965/12/17  Age: 51 y.o. MRN: [id number]  CC: The primary encounter diagnosis was Acute prostatitis. Diagnoses of Left lower quadrant pain and Urinary frequency were also pertinent to this visit.  HPI [name] presents for evaluation  and treatment of urinary symptoms.  Patient states that he developed a sharp pain in his lower back left CVA/flank , accompanied by nausea  With vomiting (one episode) about 4 weeks ago.  The pain Resolved after 24 hours,  Spent a few hours in the hot tub overnight .Marland Kitchen However the following day started to have urinary frequency accompanied by burning and urgency.  He has been  voiding small amounts for the past 3 weeks.  No fevers,  No recurrent of nausea or vomiting.  No diarrhea. Came in yesterday for b12 injection and submitted a urine sample.  UA was negative and micro showed only ca oxalate crystals,  No WBC  Lab Results  Component Value Date   PSA 0.69 09/18/2017   PSA 0.61 02/11/2017   PSA 0.78 12/20/2015       Outpatient Medications Prior to Visit  Medication Sig Dispense Refill  . ALPRAZolam (XANAX) 1 MG tablet Take 1 tablet (1 mg total) by mouth 2 (two) times daily as needed. for anxiety 30 tablet 5  . amLODipine (NORVASC) 10 MG tablet TAKE 1 TABLET BY MOUTH DAILY 90 tablet 1  . losartan (COZAAR) 25 MG tablet TAKE ONE TABLET BY MOUTH EVERY DAY 90 tablet 1  . omeprazole (PRILOSEC) 20 MG capsule TAKE 1 CAPSULE BY MOUTH EVERY DAY 90 capsule 1  . traMADol (ULTRAM) 50 MG tablet Take 1 tablet (50 mg total) by mouth every 6 (six) hours as needed. 90 tablet 3  . zolpidem (AMBIEN) 10 MG tablet Take 1 tablet (10 mg total) by mouth at bedtime as needed for sleep. 30 tablet 5  . amLODipine (NORVASC) 10 MG tablet Take 1 tablet (10 mg total) by mouth daily. (Patient not taking: Reported on 09/18/2017) 90 tablet 1  . ergocalciferol (DRISDOL) 50000 units capsule Take 1 capsule (50,000 Units total)  by mouth once a week. (Patient not taking: Reported on 09/18/2017) 12 capsule 1  . losartan (COZAAR) 25 MG tablet Take 1 tablet (25 mg total) by mouth daily. (Patient not taking: Reported on 09/18/2017) 90 tablet 1  . omeprazole (PRILOSEC) 20 MG capsule Take 1 capsule (20 mg total) by mouth daily. (Patient not taking: Reported on 09/18/2017) 90 capsule 0   Facility-Administered Medications Prior to Visit  Medication Dose Route Frequency Provider Last Rate Last Dose  . cyanocobalamin ((VITAMIN B-12)) injection 1,000 mcg  1,000 mcg Intramuscular Once [name], MD        Review of Systems;  Patient denies headache, fevers, malaise, unintentional weight loss, skin rash, eye pain, sinus congestion and sinus pain, sore throat, dysphagia,  hemoptysis , cough, dyspnea, wheezing, chest pain, palpitations, orthopnea, edema, abdominal pain, nausea, melena, diarrhea, constipation,hematuria, , numbness, tingling, seizures,  Focal weakness, Loss of consciousness,  Tremor, insomnia, depression, anxiety, and suicidal ideation.      Objective:  BP 112/82 (BP Location: Left Arm, Patient Position: Sitting, Cuff Size: Large)   Pulse 88   Temp 98.6 F (37 C) (Oral)   Resp 15   Ht 6\' 3"  (1.905 m)   Wt 285 lb 12.8 oz (129.6 kg)   SpO2 97%   BMI 35.72 kg/m   BP Readings from Last  3 Encounters:  09/18/17 112/82  08/20/17 122/80  02/11/17 (!) 150/78    Wt Readings from Last 3 Encounters:  09/18/17 285 lb 12.8 oz (129.6 kg)  08/20/17 286 lb 9.6 oz (130 kg)  02/11/17 285 lb (129.3 kg)    General appearance: alert, cooperative and appears stated age Ears: normal TM's and external ear canals both ears Throat: lips, mucosa, and tongue normal; teeth and gums normal Neck: no adenopathy, no carotid bruit, supple, symmetrical, trachea midline and thyroid not enlarged, symmetric, no tenderness/mass/nodules Back: symmetric, no curvature. ROM normal. No CVA tenderness. Lungs: clear to auscultation  bilaterally Heart: regular rate and rhythm, S1, S2 normal, no murmur, click, rub or gallop Abdomen: soft, non-tender; bowel sounds normal; no masses,  no organomegaly Pulses: 2+ and symmetric Skin: Skin color, texture, turgor normal. No rashes or lesions Lymph nodes: Cervical, supraclavicular, and axillary nodes normal.  Lab Results  Component Value Date   HGBA1C 6.2 08/15/2017   HGBA1C 6.1 02/11/2017    Lab Results  Component Value Date   CREATININE 1.08 08/15/2017   CREATININE 0.98 02/11/2017   CREATININE 1.01 12/20/2015    Lab Results  Component Value Date   WBC 6.1 09/18/2017   HGB 14.7 09/18/2017   HCT 44.8 09/18/2017   PLT 239.0 09/18/2017   GLUCOSE 114 (H) 08/15/2017   CHOL 188 08/15/2017   TRIG 191.0 (H) 08/15/2017   HDL 51.30 08/15/2017   LDLDIRECT 103.0 08/15/2017   LDLCALC 99 08/15/2017   ALT 19 08/15/2017   AST 15 08/15/2017   NA 139 08/15/2017   K 4.2 08/15/2017   CL 105 08/15/2017   CREATININE 1.08 08/15/2017   BUN 15 08/15/2017   CO2 27 08/15/2017   TSH 1.07 02/11/2017   PSA 0.69 09/18/2017   HGBA1C 6.2 08/15/2017   MICROALBUR 2.0 (H) 02/11/2017    No results found.  Assessment & Plan:   Problem List Items Addressed This Visit    Urinary frequency    Voiding small amounts,  Frequently ,  For the past 4 weeks,  After an episode of low back pain on the left side reminiscent of stone. PSA normal ,  UA normal.  CT scan ordered  To evaluate for persistent stones,  BPD and hydronephrosis        Other Visit Diagnoses    Acute prostatitis    -  Primary   Relevant Orders   PSA (Completed)   CBC with Differential/Platelet (Completed)   Sedimentation rate (Completed)   Left lower quadrant pain       Relevant Orders   CT RENAL STONE STUDY   CBC with Differential/Platelet (Completed)   Sedimentation rate (Completed)      I have discontinued Mr. Weinel's ergocalciferol. I am also having him maintain his ALPRAZolam, zolpidem, traMADol, losartan,  amLODipine, and omeprazole. We will continue to administer cyanocobalamin.  No orders of the defined types were placed in this encounter.   Medications Discontinued During This Encounter  Medication Reason  . amLODipine (NORVASC) 10 MG tablet Patient has not taken in last 30 days  . ergocalciferol (DRISDOL) 50000 units capsule Patient has not taken in last 30 days  . losartan (COZAAR) 25 MG tablet Patient has not taken in last 30 days  . omeprazole (PRILOSEC) 20 MG capsule Patient has not taken in last 30 days    Follow-up: No Follow-up on file.   [name], MD

## 2017-09-18 NOTE — Patient Instructions (Signed)
You may have prostatitis vs a stone at the bladder neck  If the PSA is elevated,  I will treat you for prostatitis .  If not,  The CT will be helpful in determining the cause of your symptoms

## 2017-09-24 ENCOUNTER — Telehealth: Payer: Self-pay | Admitting: Internal Medicine

## 2017-09-24 ENCOUNTER — Ambulatory Visit
Admission: RE | Admit: 2017-09-24 | Discharge: 2017-09-24 | Disposition: A | Discharge status: Home / Self Care | Payer: 59 | Source: Ambulatory Visit | Attending: Internal Medicine | Admitting: Internal Medicine

## 2017-09-24 DIAGNOSIS — R1032 Left lower quadrant pain: Secondary | ICD-10-CM

## 2017-09-24 NOTE — Telephone Encounter (Signed)
Spoke with pt and he stated that he canceled his CT scan for today due to his insurance. He stated that it was going to cost him $2000. Pt stated that he has not had any symptoms in 2 weeks. Pt would like to wait until after the first of the year to get the scan done unless something comes up and he has to get it sooner.

## 2017-09-24 NOTE — Telephone Encounter (Signed)
Patient had a CT scan done today. He stated he has questions regarding it, and would like a nurse to give him a call back.

## 2017-09-25 NOTE — Telephone Encounter (Signed)
Pt stated that he would let us know if symptoms return.

## 2017-09-25 NOTE — Telephone Encounter (Signed)
THAT'S FINE , IF SYMPTOMS RETURN  TELL HIM TO LET ME KNOW

## 2017-10-22 ENCOUNTER — Ambulatory Visit (INDEPENDENT_AMBULATORY_CARE_PROVIDER_SITE_OTHER): Payer: 59 | Admitting: *Deleted

## 2017-10-22 DIAGNOSIS — E538 Deficiency of other specified B group vitamins: Secondary | ICD-10-CM | POA: Diagnosis not present

## 2017-10-22 MED ORDER — CYANOCOBALAMIN 1000 MCG/ML IJ SOLN
1000.0000 ug | Freq: Once | INTRAMUSCULAR | Status: AC
  Administered 2017-10-22: 1000 ug via INTRAMUSCULAR

## 2017-10-22 NOTE — Progress Notes (Signed)
Patient presented for B 12 injection to left deltoid, patient voiced no concerns nor showed any signs of distress during injection. 

## 2017-11-27 ENCOUNTER — Ambulatory Visit (INDEPENDENT_AMBULATORY_CARE_PROVIDER_SITE_OTHER): Payer: PRIVATE HEALTH INSURANCE | Admitting: *Deleted

## 2017-11-27 DIAGNOSIS — E538 Deficiency of other specified B group vitamins: Secondary | ICD-10-CM | POA: Diagnosis not present

## 2017-11-27 MED ORDER — CYANOCOBALAMIN 1000 MCG/ML IJ SOLN
1000.0000 ug | Freq: Once | INTRAMUSCULAR | Status: AC
  Administered 2017-11-27: 1000 ug via INTRAMUSCULAR

## 2017-11-27 NOTE — Progress Notes (Signed)
Patient presented for B 12 injection to right deltoid, patient voiced no concerns nor showed any signs of distress during injection. 

## 2017-12-25 ENCOUNTER — Other Ambulatory Visit: Payer: Self-pay | Admitting: Unknown Physician Specialty

## 2017-12-25 DIAGNOSIS — R059 Cough, unspecified: Secondary | ICD-10-CM

## 2017-12-25 DIAGNOSIS — R05 Cough: Secondary | ICD-10-CM

## 2017-12-26 ENCOUNTER — Ambulatory Visit
Admission: RE | Admit: 2017-12-26 | Discharge: 2017-12-26 | Disposition: A | Discharge status: Home / Self Care | Payer: PRIVATE HEALTH INSURANCE | Source: Ambulatory Visit | Attending: Unknown Physician Specialty | Admitting: Unknown Physician Specialty

## 2017-12-26 DIAGNOSIS — R05 Cough: Secondary | ICD-10-CM | POA: Diagnosis present

## 2017-12-26 DIAGNOSIS — R918 Other nonspecific abnormal finding of lung field: Secondary | ICD-10-CM | POA: Diagnosis not present

## 2017-12-26 DIAGNOSIS — R059 Cough, unspecified: Secondary | ICD-10-CM

## 2017-12-31 ENCOUNTER — Ambulatory Visit (INDEPENDENT_AMBULATORY_CARE_PROVIDER_SITE_OTHER): Payer: PRIVATE HEALTH INSURANCE | Admitting: *Deleted

## 2017-12-31 DIAGNOSIS — E538 Deficiency of other specified B group vitamins: Secondary | ICD-10-CM

## 2017-12-31 MED ORDER — CYANOCOBALAMIN 1000 MCG/ML IJ SOLN
1000.0000 ug | Freq: Once | INTRAMUSCULAR | Status: AC
  Administered 2017-12-31: 1000 ug via INTRAMUSCULAR

## 2017-12-31 NOTE — Progress Notes (Signed)
Patient presented for B 12 injection to left deltoid, patient voiced no concerns nor showed any signs of distress during injection. 

## 2018-01-02 NOTE — Progress Notes (Signed)
  I have reviewed the above information and agree with above.   Jaynell Castagnola, MD 

## 2018-02-04 ENCOUNTER — Ambulatory Visit (INDEPENDENT_AMBULATORY_CARE_PROVIDER_SITE_OTHER): Payer: PRIVATE HEALTH INSURANCE

## 2018-02-04 DIAGNOSIS — E538 Deficiency of other specified B group vitamins: Secondary | ICD-10-CM

## 2018-02-04 MED ORDER — CYANOCOBALAMIN 1000 MCG/ML IJ SOLN
1000.0000 ug | Freq: Once | INTRAMUSCULAR | Status: AC
  Administered 2018-02-04: 1000 ug via INTRAMUSCULAR

## 2018-02-04 NOTE — Progress Notes (Signed)
Reviewed.  Dr Derrel Nip

## 2018-02-04 NOTE — Progress Notes (Signed)
Pt presents today for B12 injection. Right deltoid. Patient voiced no concerns nor showed any signs of distress during injection.

## 2018-02-27 ENCOUNTER — Telehealth: Payer: Self-pay | Admitting: Internal Medicine

## 2018-02-27 NOTE — Telephone Encounter (Signed)
Copied from [place] [id number]. Topic: Quick Communication - See Telephone Encounter >> Feb 27, 2018  1:40 PM Vernona Rieger wrote: CRM for notification. See Telephone encounter for: 02/27/18.  Patient wants to know if he can move his B12 before or after 4/19. He will be out of town 4/19. I called the office and Patina advised that she would clarify and have a nurse to call him back. Call back is [phone]. He wants to know if he can do it 4/12.

## 2018-02-27 NOTE — Telephone Encounter (Signed)
b12 injection has been rescheduled for 03/06/2018. Pt is aware of appt date and time.

## 2018-03-04 ENCOUNTER — Other Ambulatory Visit: Payer: Self-pay | Admitting: Internal Medicine

## 2018-03-04 NOTE — Telephone Encounter (Signed)
Zolpidem   Refilled: 08/20/2017  Alprazolam   Refilled: 08/20/2017  Last OV: 09/18/2017 Next OV: not scheduled

## 2018-03-05 NOTE — Telephone Encounter (Signed)
I cannot get these to print.  Please handle.  Refill for 30 days only.  OFFICE VISIT NEEDED prior to any more refills

## 2018-03-06 ENCOUNTER — Ambulatory Visit (INDEPENDENT_AMBULATORY_CARE_PROVIDER_SITE_OTHER): Payer: PRIVATE HEALTH INSURANCE | Admitting: *Deleted

## 2018-03-06 DIAGNOSIS — E538 Deficiency of other specified B group vitamins: Secondary | ICD-10-CM

## 2018-03-06 NOTE — Telephone Encounter (Signed)
Called 30 day supply into pharmacy and scheduled patient.

## 2018-03-07 DIAGNOSIS — E538 Deficiency of other specified B group vitamins: Secondary | ICD-10-CM | POA: Diagnosis not present

## 2018-03-07 MED ORDER — CYANOCOBALAMIN 1000 MCG/ML IJ SOLN
1000.0000 ug | Freq: Once | INTRAMUSCULAR | Status: AC
  Administered 2018-03-07: 1000 ug via INTRAMUSCULAR

## 2018-03-07 NOTE — Progress Notes (Signed)
Patient presented for B 12 injection to left deltoid, patient voiced no concerns nor showed any signs of distress during injection. 

## 2018-03-11 ENCOUNTER — Ambulatory Visit: Payer: PRIVATE HEALTH INSURANCE

## 2018-04-01 ENCOUNTER — Ambulatory Visit: Payer: PRIVATE HEALTH INSURANCE | Admitting: Internal Medicine

## 2018-04-01 ENCOUNTER — Encounter: Payer: Self-pay | Admitting: Internal Medicine

## 2018-04-01 VITALS — BP 120/76 | HR 79 | Temp 98.4°F | Resp 15 | Ht 75.0 in | Wt 284.0 lb

## 2018-04-01 DIAGNOSIS — Z6837 Body mass index (BMI) 37.0-37.9, adult: Secondary | ICD-10-CM | POA: Diagnosis not present

## 2018-04-01 DIAGNOSIS — M25561 Pain in right knee: Secondary | ICD-10-CM

## 2018-04-01 DIAGNOSIS — E559 Vitamin D deficiency, unspecified: Secondary | ICD-10-CM | POA: Diagnosis not present

## 2018-04-01 DIAGNOSIS — E781 Pure hyperglyceridemia: Secondary | ICD-10-CM

## 2018-04-01 DIAGNOSIS — R7303 Prediabetes: Secondary | ICD-10-CM

## 2018-04-01 DIAGNOSIS — I10 Essential (primary) hypertension: Secondary | ICD-10-CM | POA: Diagnosis not present

## 2018-04-01 DIAGNOSIS — F5101 Primary insomnia: Secondary | ICD-10-CM

## 2018-04-01 DIAGNOSIS — G8929 Other chronic pain: Secondary | ICD-10-CM | POA: Diagnosis not present

## 2018-04-01 DIAGNOSIS — F411 Generalized anxiety disorder: Secondary | ICD-10-CM

## 2018-04-01 DIAGNOSIS — M25562 Pain in left knee: Secondary | ICD-10-CM

## 2018-04-01 MED ORDER — ALPRAZOLAM 1 MG PO TABS: 1.0000 mg | ORAL_TABLET | Freq: Every day | ORAL | 5 refills | Status: DC | PRN

## 2018-04-01 MED ORDER — TRAMADOL HCL 50 MG PO TABS: 50.0000 mg | ORAL_TABLET | Freq: Four times a day (QID) | ORAL | 3 refills | Status: DC | PRN

## 2018-04-01 MED ORDER — LOSARTAN POTASSIUM 25 MG PO TABS: 25.0000 mg | ORAL_TABLET | Freq: Every day | ORAL | 1 refills | Status: DC

## 2018-04-01 MED ORDER — ZOLPIDEM TARTRATE 10 MG PO TABS: 10.0000 mg | ORAL_TABLET | Freq: Every evening | ORAL | 5 refills | Status: DC | PRN

## 2018-04-01 NOTE — Progress Notes (Signed)
Subjective:  Patient ID: Bryan Galloway, male    DOB: June 24, 1966  Age: 52 y.o. MRN: [id number]  CC: The primary encounter diagnosis was Hypertriglyceridemia. Diagnoses of Prediabetes, Vitamin D deficiency, Class 2 severe obesity due to excess calories with serious comorbidity and body mass index (BMI) of 37.0 to 37.9 in adult [facility]), Essential hypertension, Primary insomnia, Anxiety, generalized, and Chronic pain of both knees were also pertinent to this visit.  HPI Bryan Galloway presents for follow up on multiple issues including obesity with prediabetes noted at last visit,  Hypertension,  Chronic knee and ankle pain managed with tramadol,  And GAD Shirleen Schirmer managed with  ambien   Prediabetes  With obesity diagnosed last September . Weight loss advised   Has not lost weight  Not exercising,  Working 11 hours daily,    Diet reviewed.  `  Insomnia:  Patient alternates between ambien and alprazolam.  Uses alprazolam rarely for management of anxiety/stress brought on by work issues.  Does not mix with alcohol.  Does not use  Daily   Knee pain still problematic.    Hypertension: patient checks blood pressure twice weekly at home.  Readings have been for the most part < 140/80 at rest . Patient is following a reduce salt diet most days and is taking medications as prescribed  Lab Results  Component Value Date   HGBA1C 6.0 04/01/2018     Outpatient Medications Prior to Visit  Medication Sig Dispense Refill  . amLODipine (NORVASC) 10 MG tablet TAKE 1 TABLET BY MOUTH DAILY 90 tablet 1  . omeprazole (PRILOSEC) 20 MG capsule TAKE 1 CAPSULE BY MOUTH EVERY DAY 90 capsule 1  . ALPRAZolam (XANAX) 1 MG tablet Take 1 tablet (1 mg total) by mouth 2 (two) times daily as needed. for anxiety 30 tablet 5  . losartan (COZAAR) 25 MG tablet TAKE ONE TABLET BY MOUTH EVERY DAY 90 tablet 1  . traMADol (ULTRAM) 50 MG tablet Take 1 tablet (50 mg total) by mouth every 6 (six) hours as needed. 90 tablet 3  .  zolpidem (AMBIEN) 10 MG tablet Take 1 tablet (10 mg total) by mouth at bedtime as needed for sleep. 30 tablet 5   Facility-Administered Medications Prior to Visit  Medication Dose Route Frequency Provider Last Rate Last Dose  . cyanocobalamin ((VITAMIN B-12)) injection 1,000 mcg  1,000 mcg Intramuscular Once [name], MD        Review of Systems;  Patient denies headache, fevers, malaise, unintentional weight loss, skin rash, eye pain, sinus congestion and sinus pain, sore throat, dysphagia,  hemoptysis , cough, dyspnea, wheezing, chest pain, palpitations, orthopnea, edema, abdominal pain, nausea, melena, diarrhea, constipation, flank pain, dysuria, hematuria, urinary  Frequency, nocturia, numbness, tingling, seizures,  Focal weakness, Loss of consciousness,  Tremor,  depression,  and suicidal ideation.      Objective:  BP 120/76 (BP Location: Left Arm, Patient Position: Sitting, Cuff Size: Large)   Pulse 79   Temp 98.4 F (36.9 C) (Oral)   Resp 15   Ht 6\' 3"  (1.905 m)   Wt 284 lb (128.8 kg)   SpO2 98%   BMI 35.50 kg/m   BP Readings from Last 3 Encounters:  04/01/18 120/76  09/18/17 112/82  08/20/17 122/80    Wt Readings from Last 3 Encounters:  04/01/18 284 lb (128.8 kg)  09/18/17 285 lb 12.8 oz (129.6 kg)  08/20/17 286 lb 9.6 oz (130 kg)    General appearance: alert, cooperative and  appears stated age Ears: normal TM's and external ear canals both ears Throat: lips, mucosa, and tongue normal; teeth and gums normal Neck: no adenopathy, no carotid bruit, supple, symmetrical, trachea midline and thyroid not enlarged, symmetric, no tenderness/mass/nodules Back: symmetric, no curvature. ROM normal. No CVA tenderness. Lungs: clear to auscultation bilaterally Heart: regular rate and rhythm, S1, S2 normal, no murmur, click, rub or gallop Abdomen: soft, non-tender; bowel sounds normal; no masses,  no organomegaly Pulses: 2+ and symmetric Skin: Skin color, texture, turgor  normal. No rashes or lesions Lymph nodes: Cervical, supraclavicular, and axillary nodes normal.  Lab Results  Component Value Date   HGBA1C 6.0 04/01/2018   HGBA1C 6.2 08/15/2017   HGBA1C 6.1 02/11/2017    Lab Results  Component Value Date   CREATININE 1.19 04/01/2018   CREATININE 1.08 08/15/2017   CREATININE 0.98 02/11/2017    Lab Results  Component Value Date   WBC 6.1 09/18/2017   HGB 14.7 09/18/2017   HCT 44.8 09/18/2017   PLT 239.0 09/18/2017   GLUCOSE 96 04/01/2018   CHOL 184 04/01/2018   TRIG 224.0 (H) 04/01/2018   HDL 42.20 04/01/2018   LDLDIRECT 110.0 04/01/2018   LDLCALC 99 08/15/2017   ALT 19 04/01/2018   AST 17 04/01/2018   NA 139 04/01/2018   K 4.4 04/01/2018   CL 103 04/01/2018   CREATININE 1.19 04/01/2018   BUN 17 04/01/2018   CO2 27 04/01/2018   TSH 1.07 02/11/2017   PSA 0.69 09/18/2017   HGBA1C 6.0 04/01/2018   MICROALBUR 2.0 (H) 02/11/2017    Dg Chest 2 View  Result Date: 12/26/2017 CLINICAL DATA:  Patient states that he has had congestion and a dry cough for 6weeks. HTN. Nonsmoker. EXAM: CHEST  2 VIEW COMPARISON:  None. FINDINGS: Heart size is normal. There is mild perihilar peribronchial thickening. There are no focal consolidations or pleural effusions. IMPRESSION: Mild bronchitic thickening. Electronically Signed   By: Nolon Nations M.D.   On: 12/26/2017 12:17    Assessment & Plan:   Problem List Items Addressed This Visit    Vitamin D deficiency    Recurrent ,  medagose re prescribed for 6 months       Relevant Orders   VITAMIN D 25 Hydroxy (Vit-D Deficiency, Fractures) (Completed)   Prediabetes    Secondary to obesity and lack of exercise,  Mediterranean diet reviewed,  Exercise regiemens discussed. A1c has improved.  Lab Results  Component Value Date   HGBA1C 6.0 04/01/2018         Relevant Orders   Hemoglobin A1c (Completed)   Comprehensive metabolic panel (Completed)   Obesity    I have addressed  BMI and recommended  wt loss of 10% of body weight over the next 6 months using a low glycemic index diet and regular exercise a minimum of 5 days per week.        Knee pain, chronic    Involving right knee greater than left. Some crepitus noted, without effusion . Marland Kitchen Again encouraged to lose 20 lbs  And start exercising.  Tramadol refilled.        Relevant Medications   traMADol (ULTRAM) 50 MG tablet   Insomnia    Managed with ambien.  The risks and benefits of ambien use were reviewed with patient today including excessive sedation leading to respiratory depression,  impaired thinking/driving, and addiction.  Patient was advised to avoid concurrent use with alcohol, to use medication only as needed, not to exceed 10  mg and  and not to share with others. Refills given.         Hypertriglyceridemia - Primary    Mild,  Managed with diet,  Should improve with exercise  Lab Results  Component Value Date   CHOL 184 04/01/2018   HDL 42.20 04/01/2018   LDLCALC 99 08/15/2017   LDLDIRECT 110.0 04/01/2018   TRIG 224.0 (H) 04/01/2018   CHOLHDL 4 04/01/2018         Relevant Medications   losartan (COZAAR) 25 MG tablet   Other Relevant Orders   Lipid panel (Completed)   LDL cholesterol, direct (Completed)   Essential hypertension    Well controlled on current regimen. Renal function stable, no changes today.  Lab Results  Component Value Date   CREATININE 1.19 04/01/2018   Lab Results  Component Value Date   NA 139 04/01/2018   K 4.4 04/01/2018   CL 103 04/01/2018   CO2 27 04/01/2018         Relevant Medications   losartan (COZAAR) 25 MG tablet   Anxiety, generalized    I have refilled the alprazolam for prn use.  The risks and benefits of benzodiazepine use were reviewed  with patient today including excessive sedation leading to respiratory depression,  impaired thinking/driving, and addiction.  Patient was advised to avoid concurrent use with alcohol, to use medication only as needed and not  to share with others  .       Relevant Medications   ALPRAZolam (XANAX) 1 MG tablet      I have changed Leory Plowman K. Kuenzi's ALPRAZolam, traMADol, and losartan. I am also having him start on ergocalciferol. Additionally, I am having him maintain his omeprazole, amLODipine, and zolpidem. We will continue to administer cyanocobalamin.  Meds ordered this encounter  Medications  . zolpidem (AMBIEN) 10 MG tablet    Sig: Take 1 tablet (10 mg total) by mouth at bedtime as needed for sleep.    Dispense:  30 tablet    Refill:  5  . ALPRAZolam (XANAX) 1 MG tablet    Sig: Take 1 tablet (1 mg total) by mouth daily as needed. for anxiety    Dispense:  30 tablet    Refill:  5  . traMADol (ULTRAM) 50 MG tablet    Sig: Take 1 tablet (50 mg total) by mouth every 6 (six) hours as needed (chronic knee and ankle pain).    Dispense:  90 tablet    Refill:  3  . losartan (COZAAR) 25 MG tablet    Sig: Take 1 tablet (25 mg total) by mouth daily.    Dispense:  90 tablet    Refill:  1    FOR FUTURE REFILLSWANTS 90 DAYS  . ergocalciferol (DRISDOL) 50000 units capsule    Sig: Take 1 capsule (50,000 Units total) by mouth once a week.    Dispense:  12 capsule    Refill:  1    Medications Discontinued During This Encounter  Medication Reason  . zolpidem (AMBIEN) 10 MG tablet Reorder  . ALPRAZolam (XANAX) 1 MG tablet Reorder  . traMADol (ULTRAM) 50 MG tablet Reorder  . losartan (COZAAR) 25 MG tablet Reorder    Follow-up: Return in about 6 months (around 10/02/2018) for CPE.   [name], MD

## 2018-04-01 NOTE — Patient Instructions (Addendum)
You had "prediabetes" last time you were seen,  We are rechecking labs today   Danton Clap now makes a frozen breakfast frittata and  An eggwhich,  Both can be  microwaved in 2 minutes and is very low carb. Frittatas are similar to quiches without the crust   Truett Perna "Just crack an egg "   Is also low carb  And easy to fix        Why follow it? Research shows. . Those who follow the Mediterranean diet have a reduced risk of heart disease  . The diet is associated with a reduced incidence of Parkinson's and Alzheimer's diseases . People following the diet may have longer life expectancies and lower rates of chronic diseases  . The Dietary Guidelines for Americans recommends the Mediterranean diet as an eating plan to promote health and prevent disease  What Is the Mediterranean Diet?  . Healthy eating plan based on typical foods and recipes of Mediterranean-style cooking . The diet is primarily a plant based diet; these foods should make up a majority of meals   Starches - Plant based foods should make up a majority of meals - They are an important sources of vitamins, minerals, energy, antioxidants, and fiber - Choose whole grains, foods high in fiber and minimally processed items  - Typical grain sources include wheat, oats, barley, corn, brown rice, bulgar, farro, millet, polenta, couscous  - Various types of beans include chickpeas, lentils, fava beans, black beans, white beans   Fruits  Veggies - Large quantities of antioxidant rich fruits & veggies; 6 or more servings  - Vegetables can be eaten raw or lightly drizzled with oil and cooked  - Vegetables common to the traditional Mediterranean Diet include: artichokes, arugula, beets, broccoli, brussel sprouts, cabbage, carrots, celery, collard greens, cucumbers, eggplant, kale, leeks, lemons, lettuce, mushrooms, okra, onions, peas, peppers, potatoes, pumpkin, radishes, rutabaga, shallots, spinach, sweet potatoes, turnips, zucchini -  Fruits common to the Mediterranean Diet include: apples, apricots, avocados, cherries, clementines, dates, figs, grapefruits, grapes, melons, nectarines, oranges, peaches, pears, pomegranates, strawberries, tangerines  Fats - Replace butter and margarine with healthy oils, such as olive oil, canola oil, and tahini  - Limit nuts to no more than a handful a day  - Nuts include walnuts, almonds, pecans, pistachios, pine nuts  - Limit or avoid candied, honey roasted or heavily salted nuts - Olives are central to the [employer] - can be eaten whole or used in a variety of dishes   Meats Protein - Limiting red meat: no more than a few times a month - When eating red meat: choose lean cuts and keep the portion to the size of deck of cards - Eggs: approx. 0 to 4 times a week  - Fish and lean poultry: at least 2 a week  - Healthy protein sources include, chicken, [place], lean beef, lamb - Increase intake of seafood such as tuna, salmon, trout, mackerel, shrimp, scallops - Avoid or limit high fat processed meats such as sausage and bacon  Dairy - Include moderate amounts of low fat dairy products  - Focus on healthy dairy such as fat free yogurt, skim milk, low or reduced fat cheese - Limit dairy products higher in fat such as whole or 2% milk, cheese, ice cream  Alcohol - Moderate amounts of red wine is ok  - No more than 5 oz daily for women (all ages) and men older than age 14  - No more than 10  oz of wine daily for men younger than 24  Other - Limit sweets and other desserts  - Use herbs and spices instead of salt to flavor foods  - Herbs and spices common to the traditional Mediterranean Diet include: basil, bay leaves, chives, cloves, cumin, fennel, garlic, lavender, marjoram, mint, oregano, parsley, pepper, rosemary, sage, savory, sumac, tarragon, thyme   It's not just a diet, it's a lifestyle:  . The Mediterranean diet includes lifestyle factors typical of those in the region  . Foods,  drinks and meals are best eaten with others and savored . Daily physical activity is important for overall good health . This could be strenuous exercise like running and aerobics . This could also be more leisurely activities such as walking, housework, yard-work, or taking the stairs . Moderation is the key; a balanced and healthy diet accommodates most foods and drinks . Consider portion sizes and frequency of consumption of certain foods   Meal Ideas & Options:  . Breakfast:  o Whole wheat toast or whole wheat English muffins with peanut butter & hard boiled egg o Steel cut oats topped with apples & cinnamon and skim milk  o Fresh fruit: banana, strawberries, melon, berries, peaches  o Smoothies: strawberries, bananas, greek yogurt, peanut butter o Low fat greek yogurt with blueberries and granola  o Egg white omelet with spinach and mushrooms o Breakfast couscous: whole wheat couscous, apricots, skim milk, cranberries  . Sandwiches:  o Hummus and grilled vegetables (peppers, zucchini, squash) on whole wheat bread   o Grilled chicken on whole wheat pita with lettuce, tomatoes, cucumbers or tzatziki  o Tuna salad on whole wheat bread: tuna salad made with greek yogurt, olives, red peppers, capers, green onions o Garlic rosemary lamb pita: lamb sauted with garlic, rosemary, salt & pepper; add lettuce, cucumber, greek yogurt to pita - flavor with lemon juice and black pepper  . Seafood:  o Mediterranean grilled salmon, seasoned with garlic, basil, parsley, lemon juice and black pepper o Shrimp, lemon, and spinach whole-grain pasta salad made with low fat greek yogurt  o Seared scallops with lemon orzo  o Seared tuna steaks seasoned salt, pepper, coriander topped with tomato mixture of olives, tomatoes, olive oil, minced garlic, parsley, green onions and cappers  . Meats:  o Herbed greek chicken salad with kalamata olives, cucumber, feta  o Red bell peppers stuffed with spinach, bulgur,  lean ground beef (or lentils) & topped with feta   o Kebabs: skewers of chicken, tomatoes, onions, zucchini, squash  o [place] burgers: made with red onions, mint, dill, lemon juice, feta cheese topped with roasted red peppers . Vegetarian o Cucumber salad: cucumbers, artichoke hearts, celery, red onion, feta cheese, tossed in olive oil & lemon juice  o Hummus and whole grain pita points with a greek salad (lettuce, tomato, feta, olives, cucumbers, red onion) o Lentil soup with celery, carrots made with vegetable broth, garlic, salt and pepper  o Tabouli salad: parsley, bulgur, mint, scallions, cucumbers, tomato, radishes, lemon juice, olive oil, salt and pepper.

## 2018-04-02 LAB — LIPID PANEL
Cholesterol: 184 mg/dL (ref 0–200)
HDL: 42.2 mg/dL (ref >39.00)
NonHDL: 141.77
Total CHOL/HDL Ratio: 4
Triglycerides: 224 mg/dL — ABNORMAL HIGH (ref 0.0–149.0)
VLDL: 44.8 mg/dL — ABNORMAL HIGH (ref 0.0–40.0)

## 2018-04-02 LAB — COMPREHENSIVE METABOLIC PANEL
ALT: 19 U/L (ref 0–53)
AST: 17 U/L (ref 0–37)
Albumin: 4.6 g/dL (ref 3.5–5.2)
Alkaline Phosphatase: 50 U/L (ref 39–117)
BUN: 17 mg/dL (ref 6–23)
CO2: 27 mEq/L (ref 19–32)
Calcium: 9.6 mg/dL (ref 8.4–10.5)
Chloride: 103 mEq/L (ref 96–112)
Creatinine, Ser: 1.19 mg/dL (ref 0.40–1.50)
GFR: 67.66 mL/min (ref >60.00)
Glucose, Bld: 96 mg/dL (ref 70–99)
Potassium: 4.4 mEq/L (ref 3.5–5.1)
Sodium: 139 mEq/L (ref 135–145)
Total Bilirubin: 0.6 mg/dL (ref 0.2–1.2)
Total Protein: 7.1 g/dL (ref 6.0–8.3)

## 2018-04-02 LAB — HEMOGLOBIN A1C: Hgb A1c MFr Bld: 6 % (ref 4.6–6.5)

## 2018-04-02 LAB — VITAMIN D 25 HYDROXY (VIT D DEFICIENCY, FRACTURES): VITD: 18.62 ng/mL — ABNORMAL LOW (ref 30.00–100.00)

## 2018-04-02 LAB — LDL CHOLESTEROL, DIRECT: Direct LDL: 110 mg/dL

## 2018-04-03 DIAGNOSIS — F411 Generalized anxiety disorder: Secondary | ICD-10-CM | POA: Insufficient documentation

## 2018-04-03 MED ORDER — ERGOCALCIFEROL 1.25 MG (50000 UT) PO CAPS: 50000.0000 [IU] | ORAL_CAPSULE | ORAL | 1 refills | Status: DC

## 2018-04-03 NOTE — Assessment & Plan Note (Signed)
Involving right knee greater than left. Some crepitus noted, without effusion . Marland Kitchen Again encouraged to lose 20 lbs  And start exercising.  Tramadol refilled.

## 2018-04-03 NOTE — Assessment & Plan Note (Signed)
I have refilled the alprazolam for prn use.  The risks and benefits of benzodiazepine use were reviewed  with patient today including excessive sedation leading to respiratory depression,  impaired thinking/driving, and addiction.  Patient was advised to avoid concurrent use with alcohol, to use medication only as needed and not to share with others  .  

## 2018-04-03 NOTE — Assessment & Plan Note (Signed)
Mild,  Managed with diet,  Should improve with exercise  Lab Results  Component Value Date   CHOL 184 04/01/2018   HDL 42.20 04/01/2018   LDLCALC 99 08/15/2017   LDLDIRECT 110.0 04/01/2018   TRIG 224.0 (H) 04/01/2018   CHOLHDL 4 04/01/2018

## 2018-04-03 NOTE — Assessment & Plan Note (Signed)
Recurrent ,  medagose re prescribed for 6 months

## 2018-04-03 NOTE — Assessment & Plan Note (Signed)
Managed with ambien.  The risks and benefits of ambien use were reviewed with patient today including excessive sedation leading to respiratory depression,  impaired thinking/driving, and addiction.  Patient was advised to avoid concurrent use with alcohol, to use medication only as needed, not to exceed 10 mg and  and not to share with others. Refills given.    

## 2018-04-03 NOTE — Assessment & Plan Note (Signed)
Well controlled on current regimen. Renal function stable, no changes today.  Lab Results  Component Value Date   CREATININE 1.19 04/01/2018   Lab Results  Component Value Date   NA 139 04/01/2018   K 4.4 04/01/2018   CL 103 04/01/2018   CO2 27 04/01/2018

## 2018-04-03 NOTE — Assessment & Plan Note (Signed)
Secondary to obesity and lack of exercise,  Mediterranean diet reviewed,  Exercise regiemens discussed. A1c has improved.  Lab Results  Component Value Date   HGBA1C 6.0 04/01/2018    

## 2018-04-03 NOTE — Assessment & Plan Note (Signed)
I have addressed  BMI and recommended wt loss of 10% of body weight over the next 6 months using a low glycemic index diet and regular exercise a minimum of 5 days per week.   

## 2018-04-08 ENCOUNTER — Ambulatory Visit: Payer: PRIVATE HEALTH INSURANCE

## 2018-04-17 ENCOUNTER — Ambulatory Visit (INDEPENDENT_AMBULATORY_CARE_PROVIDER_SITE_OTHER): Payer: PRIVATE HEALTH INSURANCE | Admitting: *Deleted

## 2018-04-17 DIAGNOSIS — E538 Deficiency of other specified B group vitamins: Secondary | ICD-10-CM | POA: Diagnosis not present

## 2018-04-17 MED ORDER — CYANOCOBALAMIN 1000 MCG/ML IJ SOLN
1000.0000 ug | Freq: Once | INTRAMUSCULAR | Status: AC
  Administered 2018-04-17: 1000 ug via INTRAMUSCULAR

## 2018-04-17 NOTE — Progress Notes (Signed)
Patient presented for B 12 injection to right deltoid, patient voiced no concerns nor showed any signs of distress during injection. 

## 2018-04-21 NOTE — Progress Notes (Signed)
  I have reviewed the above information and agree with above.   Ellayna Hilligoss, MD 

## 2018-05-21 ENCOUNTER — Ambulatory Visit (INDEPENDENT_AMBULATORY_CARE_PROVIDER_SITE_OTHER): Payer: PRIVATE HEALTH INSURANCE

## 2018-05-21 DIAGNOSIS — E538 Deficiency of other specified B group vitamins: Secondary | ICD-10-CM | POA: Diagnosis not present

## 2018-05-21 MED ORDER — CYANOCOBALAMIN 1000 MCG/ML IJ SOLN
1000.0000 ug | Freq: Once | INTRAMUSCULAR | Status: AC
  Administered 2018-05-21: 1000 ug via INTRAMUSCULAR

## 2018-05-21 NOTE — Progress Notes (Signed)
Patient comes in today for a vitamin B 12 injection. Administered in left deltoid IM. Patient tolerated well.

## 2018-06-17 ENCOUNTER — Telehealth: Payer: Self-pay

## 2018-06-17 MED ORDER — ONDANSETRON 4 MG PO TBDP
4.0000 mg | ORAL_TABLET | Freq: Three times a day (TID) | ORAL | 0 refills | Status: DC | PRN
Start: 2018-06-17 — End: 2018-10-07

## 2018-06-17 NOTE — Addendum Note (Signed)
Addended by: [name] on: 06/17/2018 03:15 PM   Modules accepted: Orders

## 2018-06-17 NOTE — Telephone Encounter (Signed)
As long as he is not having fevers or blood in stools,  Ok to take imodium.  I will call in zofran for the nausea.

## 2018-06-17 NOTE — Telephone Encounter (Signed)
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 Scheduled patient for to morrow at 4 : 30 does not want to go to UC.

## 2018-06-17 NOTE — Telephone Encounter (Signed)
Patient notified and voiced understanding.

## 2018-06-17 NOTE — Telephone Encounter (Signed)
Copied from [place] [phone]. Topic: General - Other >> Jun 17, 2018  7:27 AM Lennox Solders wrote: Reason for CRM:pt is requesting Juliann Pulse dr Derrel Nip nurse to return his call. Pt stated the reason is personal

## 2018-06-18 ENCOUNTER — Ambulatory Visit: Payer: PRIVATE HEALTH INSURANCE | Admitting: Internal Medicine

## 2018-06-18 ENCOUNTER — Encounter: Payer: Self-pay | Admitting: Internal Medicine

## 2018-06-18 VITALS — BP 122/78 | HR 108 | Temp 98.0°F | Resp 16 | Ht 75.0 in | Wt 276.8 lb

## 2018-06-18 DIAGNOSIS — R197 Diarrhea, unspecified: Secondary | ICD-10-CM

## 2018-06-18 DIAGNOSIS — A09 Infectious gastroenteritis and colitis, unspecified: Secondary | ICD-10-CM | POA: Diagnosis not present

## 2018-06-18 MED ORDER — DIPHENOXYLATE-ATROPINE 2.5-0.025 MG PO TABS: 1.0000 | ORAL_TABLET | Freq: Four times a day (QID) | ORAL | 0 refills | Status: DC | PRN

## 2018-06-18 NOTE — Progress Notes (Signed)
Subjective:  Patient ID: Bryan Galloway, male    DOB: 05-07-1966  Age: 52 y.o. MRN: [id number]  CC: The primary encounter diagnosis was Diarrhea of presumed infectious origin. A diagnosis of Infectious diarrhea in adult patient was also pertinent to this visit.  HPI Bryan Galloway presents for evaluation and treatment of diarrhea that started on Sunday  Evening .  He woke up  Sunday morning with nausea and diaphoresis without fevers.  Had several episodes of vomiting Sunday after tyring to eat.     Diarrhea started Sunday night 2 am and states that he has been having a loose stool "every ten minutes"   Since then.  Has been taking zofran 4 mg for nausea since yesterday,  Has not helped much .    Used 4 immodium yesterday and 2 today,  Got 3 hours of sleep  Last night with xanax.    No blood in stool ,  No fevers  No abd pain  , no recent travel,  No sick contacts.   .  Had rice today stayed down along with ginger ale. Today he feels increasingly bloated and gassy.  Has not tried any simethicone products.     Outpatient Medications Prior to Visit  Medication Sig Dispense Refill  . ALPRAZolam (XANAX) 1 MG tablet Take 1 tablet (1 mg total) by mouth daily as needed. for anxiety 30 tablet 5  . amLODipine (NORVASC) 10 MG tablet TAKE 1 TABLET BY MOUTH DAILY 90 tablet 1  . ergocalciferol (DRISDOL) 50000 units capsule Take 1 capsule (50,000 Units total) by mouth once a week. 12 capsule 1  . losartan (COZAAR) 25 MG tablet Take 1 tablet (25 mg total) by mouth daily. 90 tablet 1  . ondansetron (ZOFRAN ODT) 4 MG disintegrating tablet Take 1 tablet (4 mg total) by mouth every 8 (eight) hours as needed for nausea or vomiting. 20 tablet 0  . ranitidine (ZANTAC) 150 MG capsule Take 150 mg by mouth 2 (two) times daily.    . traMADol (ULTRAM) 50 MG tablet Take 1 tablet (50 mg total) by mouth every 6 (six) hours as needed (chronic knee and ankle pain). 90 tablet 3  . zolpidem (AMBIEN) 10 MG tablet Take  1 tablet (10 mg total) by mouth at bedtime as needed for sleep. 30 tablet 5  . omeprazole (PRILOSEC) 20 MG capsule TAKE 1 CAPSULE BY MOUTH EVERY DAY (Patient not taking: Reported on 06/18/2018) 90 capsule 1   Facility-Administered Medications Prior to Visit  Medication Dose Route Frequency Provider Last Rate Last Dose  . cyanocobalamin ((VITAMIN B-12)) injection 1,000 mcg  1,000 mcg Intramuscular Once Bryan Mc, MD        Review of Systems;  Patient denies headache, fevers, malaise, unintentional weight loss, skin rash, eye pain, sinus congestion and sinus pain, sore throat, dysphagia,  hemoptysis , cough, dyspnea, wheezing, chest pain, palpitations, orthopnea, edema,  melena,constipation, flank pain, dysuria, hematuria, urinary  Frequency, nocturia, numbness, tingling, seizures,  Focal weakness, Loss of consciousness,  Tremor, insomnia, depression, anxiety, and suicidal ideation.      Objective:  BP 122/78 (BP Location: Left Arm, Patient Position: Sitting, Cuff Size: Large)   Pulse (!) 108   Temp 98 F (36.7 C) (Oral)   Resp 16   Ht 6\' 3"  (1.905 m)   Wt 276 lb 12.8 oz (125.6 kg)   SpO2 95%   BMI 34.60 kg/m   BP Readings from Last 3 Encounters:  06/18/18 122/78  04/01/18  120/76  09/18/17 112/82    Wt Readings from Last 3 Encounters:  06/18/18 276 lb 12.8 oz (125.6 kg)  04/01/18 284 lb (128.8 kg)  09/18/17 285 lb 12.8 oz (129.6 kg)    General appearance: alert, cooperative and appearsuncomfortable  Ears: normal TM's and external ear canals both ears Throat: lips, mucosa, and tongue normal; teeth and gums norma Neck: no adenopathy, no carotid bruit, supple, symmetrical, trachea midline and thyroid not enlarged, symmetric, no tenderness/mass/nodules Back: symmetric, no curvature. ROM normal. No CVA tenderness. Lungs: clear to auscultation bilaterally Heart: tachycardic  But regular  rhythm, S1, S2 normal, no murmurs click, rub or gallop Abdomen: soft, distended,  non-tender; bowel sounds hyperactive ,  no masses,  no organomegaly Pulses: 2+ and symmetric Skin: Skin color, texture, turgor normal. No rashes or lesions Lymph nodes: Cervical, supraclavicular, and axillary nodes normal.  Lab Results  Component Value Date   HGBA1C 6.0 04/01/2018   HGBA1C 6.2 08/15/2017   HGBA1C 6.1 02/11/2017    Lab Results  Component Value Date   CREATININE 1.42 06/18/2018   CREATININE 1.19 04/01/2018   CREATININE 1.08 08/15/2017    Lab Results  Component Value Date   WBC 7.8 06/18/2018   HGB 17.1 (H) 06/18/2018   HCT 50.0 06/18/2018   PLT 230.0 06/18/2018   GLUCOSE 120 (H) 06/18/2018   CHOL 184 04/01/2018   TRIG 224.0 (H) 04/01/2018   HDL 42.20 04/01/2018   LDLDIRECT 110.0 04/01/2018   LDLCALC 99 08/15/2017   ALT 52 06/18/2018   AST 47 (H) 06/18/2018   NA 137 06/18/2018   K 3.7 06/18/2018   CL 104 06/18/2018   CREATININE 1.42 06/18/2018   BUN 18 06/18/2018   CO2 22 06/18/2018   TSH 1.07 02/11/2017   PSA 0.69 09/18/2017   HGBA1C 6.0 04/01/2018   MICROALBUR 2.0 (H) 02/11/2017    Dg Chest 2 View  Result Date: 12/26/2017 CLINICAL DATA:  Patient states that he has had congestion and a dry cough for 6weeks. HTN. Nonsmoker. EXAM: CHEST  2 VIEW COMPARISON:  None. FINDINGS: Heart size is normal. There is mild perihilar peribronchial thickening. There are no focal consolidations or pleural effusions. IMPRESSION: Mild bronchitic thickening. Electronically Signed   By: Bryan Galloway M.D.   On: 12/26/2017 12:17    Assessment & Plan:   Problem List Items Addressed This Visit    Infectious diarrhea in adult patient    He appears dehydrated but is able to keep down fluids.   He has no  Signs or symptoms of bacterial diarrhea .  Prescribing  Lomotil , zofran,  Clear liquid diet.       Other Visit Diagnoses    Diarrhea of presumed infectious origin    -  Primary   Relevant Orders   Stool, WBC/Lactoferrin (Completed)   Gastrointestinal Pathogen Panel  PCR   Lipase (Completed)   Comprehensive metabolic panel (Completed)   CBC with Differential/Platelet (Completed)      I have discontinued [name]. [name]'s omeprazole. I am also having him start on diphenoxylate-atropine. Additionally, I am having him maintain his amLODipine, zolpidem, ALPRAZolam, traMADol, losartan, ergocalciferol, ondansetron, and ranitidine. We will continue to administer cyanocobalamin.  Meds ordered this encounter  Medications  . diphenoxylate-atropine (LOMOTIL) 2.5-0.025 MG tablet    Sig: Take 1 tablet by mouth 4 (four) times daily as needed for diarrhea or loose stools.    Dispense:  30 tablet    Refill:  0    Medications Discontinued During  This Encounter  Medication Reason  . omeprazole (PRILOSEC) 20 MG capsule Change in therapy    Follow-up: No follow-ups on file.   Bryan Mc, MD

## 2018-06-18 NOTE — Patient Instructions (Signed)
For the nausea,  You can increase the dose of zofran to 8 mg , or continue 4 mg and try adding Mylanta Gas  (2 hours after zofran dose)   For the gaseous distension:  Gas X and phazyme are also helpful  And can be taken with the Zofran (same time)    Keep dite bland and easy to digest  chicken broth,  Jello,  Rice,  Ginger ale ,, sprite   Can  add boiled chicken , apple sauce once the stools start to thicken  Avoid roughage and greasy foods tuntil completely back to normal  Norovirus Infection A norovirus infection is caused by exposure to a virus in a group of similar viruses (noroviruses). This type of infection causes inflammation in your stomach and intestines (gastroenteritis). Norovirus is the most common cause of gastroenteritis. It also causes food poisoning. Anyone can get a norovirus infection. It spreads very easily (contagious). You can get it from contaminated food, water, surfaces, or other people. Norovirus is found in the stool or vomit of infected people. You can spread the infection as soon as you feel sick until 2 weeks after you recover. Symptoms usually begin within 2 days after you become infected. Most norovirus symptoms affect the digestive system. What are the causes? Norovirus infection is caused by contact with norovirus. You can catch norovirus if you:  Eat or drink something contaminated with norovirus.  Touch surfaces or objects contaminated with norovirus and then put your hand in your mouth.  Have direct contact with an infected person who has symptoms.  Share food, drink, or utensils with someone with who is sick with norovirus.  What are the signs or symptoms? Symptoms of norovirus may include:  Nausea.  Vomiting.  Diarrhea.  Stomach cramps.  Fever.  Chills.  Headache.  Muscle aches.  Tiredness.  How is this diagnosed? Your health care provider may suspect norovirus based on your symptoms and physical exam. Your health care  provider may also test a sample of your stool or vomit for the virus. How is this treated? There is no specific treatment for norovirus. Most people get better without treatment in about 2 days. Follow these instructions at home:  Replace lost fluids by drinking plenty of water or rehydration fluids containing important minerals called electrolytes. This prevents dehydration. Drink enough fluid to keep your urine clear or pale yellow.  Do not prepare food for others while you are infected. Wait at least 3 days after recovering from the illness to do that. How is this prevented?  Wash your hands often, especially after using the toilet or changing a diaper.  Wash fruits and vegetables thoroughly before preparing or serving them.  Throw out any food that a sick person may have touched.  Disinfect contaminated surfaces immediately after someone in the household has been sick. Use a bleach-based household cleaner.  Immediately remove and wash soiled clothes or sheets. Contact a health care provider if:  Your vomiting, diarrhea, and stomach pain is getting worse.  Your symptoms of norovirus do not go away after 2-3 days. Get help right away if: You develop symptoms of dehydration that do not improve with fluid replacement. This may include:  Excessive sleepiness.  Lack of tears.  Dry mouth.  Dizziness when standing.  Weak pulse.  This information is not intended to replace advice given to you by your health care provider. Make sure you discuss any questions you have with your health care provider. Document Released: 02/02/2003 Document  Revised: 04/19/2016 Document Reviewed: 04/22/2014 Elsevier Interactive Patient Education  2017 [employer].

## 2018-06-19 ENCOUNTER — Telehealth: Payer: Self-pay | Admitting: Internal Medicine

## 2018-06-19 DIAGNOSIS — A09 Infectious gastroenteritis and colitis, unspecified: Secondary | ICD-10-CM | POA: Insufficient documentation

## 2018-06-19 LAB — FECAL LACTOFERRIN, QUANT
Fecal Lactoferrin: POSITIVE — AB
MICRO NUMBER:: 90877361
SPECIMEN QUALITY:: ADEQUATE

## 2018-06-19 LAB — COMPREHENSIVE METABOLIC PANEL
ALT: 52 U/L (ref 0–53)
AST: 47 U/L — ABNORMAL HIGH (ref 0–37)
Albumin: 4.9 g/dL (ref 3.5–5.2)
Alkaline Phosphatase: 66 U/L (ref 39–117)
BUN: 18 mg/dL (ref 6–23)
CO2: 22 mEq/L (ref 19–32)
Calcium: 9.6 mg/dL (ref 8.4–10.5)
Chloride: 104 mEq/L (ref 96–112)
Creatinine, Ser: 1.42 mg/dL (ref 0.40–1.50)
GFR: 55.13 mL/min — ABNORMAL LOW (ref >60.00)
Glucose, Bld: 120 mg/dL — ABNORMAL HIGH (ref 70–99)
Potassium: 3.7 mEq/L (ref 3.5–5.1)
Sodium: 137 mEq/L (ref 135–145)
Total Bilirubin: 0.8 mg/dL (ref 0.2–1.2)
Total Protein: 8 g/dL (ref 6.0–8.3)

## 2018-06-19 LAB — GASTROINTESTINAL PATHOGEN PANEL PCR
C. difficile Tox A/B, PCR: NOT DETECTED
Campylobacter, PCR: NOT DETECTED
Cryptosporidium, PCR: NOT DETECTED
E coli (ETEC) LT/ST PCR: NOT DETECTED
E coli (STEC) stx1/stx2, PCR: NOT DETECTED
E coli 0157, PCR: NOT DETECTED
Giardia lamblia, PCR: NOT DETECTED
Norovirus, PCR: NOT DETECTED
Rotavirus A, PCR: NOT DETECTED
Salmonella, PCR: NOT DETECTED
Shigella, PCR: NOT DETECTED

## 2018-06-19 LAB — CBC WITH DIFFERENTIAL/PLATELET
Basophils Absolute: 0 10*3/uL (ref 0.0–0.1)
Basophils Relative: 0.6 % (ref 0.0–3.0)
Eosinophils Absolute: 0.1 10*3/uL (ref 0.0–0.7)
Eosinophils Relative: 1 % (ref 0.0–5.0)
HCT: 50 % (ref 39.0–52.0)
Hemoglobin: 17.1 g/dL — ABNORMAL HIGH (ref 13.0–17.0)
Lymphocytes Relative: 17.3 % (ref 12.0–46.0)
Lymphs Abs: 1.3 10*3/uL (ref 0.7–4.0)
MCHC: 34.2 g/dL (ref 30.0–36.0)
MCV: 84.2 fl (ref 78.0–100.0)
Monocytes Absolute: 0.4 10*3/uL (ref 0.1–1.0)
Monocytes Relative: 5.7 % (ref 3.0–12.0)
Neutro Abs: 5.9 10*3/uL (ref 1.4–7.7)
Neutrophils Relative %: 75.4 % (ref 43.0–77.0)
Platelets: 230 10*3/uL (ref 150.0–400.0)
RBC: 5.94 Mil/uL — ABNORMAL HIGH (ref 4.22–5.81)
RDW: 13.9 % (ref 11.5–15.5)
WBC: 7.8 10*3/uL (ref 4.0–10.5)

## 2018-06-19 LAB — LIPASE: Lipase: 13 U/L (ref 11.0–59.0)

## 2018-06-19 NOTE — Assessment & Plan Note (Signed)
He appears dehydrated but is able to keep down fluids.   He has no  Signs or symptoms of bacterial diarrhea .  Prescribing  Lomotil , zofran,  Clear liquid diet.

## 2018-06-19 NOTE — Telephone Encounter (Signed)
Copied from [facility] [id number]. Topic: Quick Communication - See Telephone Encounter >> Jun 19, 2018  2:53 PM Mylinda Latina, NT wrote: CRM for notification. See Telephone encounter for: 06/19/18. Patient called and states he is inquiring about his lab results. CB# [phone]

## 2018-06-19 NOTE — Telephone Encounter (Signed)
Pt requesting lab results.

## 2018-06-19 NOTE — Telephone Encounter (Signed)
Lab results

## 2018-06-20 NOTE — Telephone Encounter (Signed)
Patient given results

## 2018-06-23 ENCOUNTER — Ambulatory Visit (INDEPENDENT_AMBULATORY_CARE_PROVIDER_SITE_OTHER): Payer: PRIVATE HEALTH INSURANCE | Admitting: *Deleted

## 2018-06-23 DIAGNOSIS — E538 Deficiency of other specified B group vitamins: Secondary | ICD-10-CM

## 2018-06-24 ENCOUNTER — Ambulatory Visit: Payer: PRIVATE HEALTH INSURANCE

## 2018-06-25 MED ORDER — CYANOCOBALAMIN 1000 MCG/ML IJ SOLN
1000.0000 ug | Freq: Once | INTRAMUSCULAR | Status: AC
  Administered 2018-06-23: 1000 ug via INTRAMUSCULAR

## 2018-06-25 NOTE — Progress Notes (Signed)
Patient presented for B 12 injection to right deltoid, patient voiced no concerns nor showed any signs of distress during injection. 

## 2018-07-29 ENCOUNTER — Ambulatory Visit (INDEPENDENT_AMBULATORY_CARE_PROVIDER_SITE_OTHER): Payer: PRIVATE HEALTH INSURANCE

## 2018-07-29 DIAGNOSIS — E538 Deficiency of other specified B group vitamins: Secondary | ICD-10-CM | POA: Diagnosis not present

## 2018-07-29 MED ORDER — CYANOCOBALAMIN 1000 MCG/ML IJ SOLN
1000.0000 ug | Freq: Once | INTRAMUSCULAR | Status: AC
  Administered 2018-07-29: 1000 ug via INTRAMUSCULAR

## 2018-07-29 NOTE — Progress Notes (Signed)
Pt came in today for monthly B12 injection. Given in left deltoid. Tolerated well, no complaints or concerns.

## 2018-08-04 NOTE — Progress Notes (Signed)
  I have reviewed the above information and agree with above.   Layli Capshaw, MD 

## 2018-09-03 ENCOUNTER — Ambulatory Visit (INDEPENDENT_AMBULATORY_CARE_PROVIDER_SITE_OTHER): Payer: PRIVATE HEALTH INSURANCE | Admitting: *Deleted

## 2018-09-03 DIAGNOSIS — E538 Deficiency of other specified B group vitamins: Secondary | ICD-10-CM

## 2018-09-03 MED ORDER — CYANOCOBALAMIN 1000 MCG/ML IJ SOLN
1000.0000 ug | Freq: Once | INTRAMUSCULAR | Status: AC
  Administered 2018-09-03: 1000 ug via INTRAMUSCULAR

## 2018-09-03 NOTE — Progress Notes (Signed)
Patient presented for B 12 injection to left deltoid, patient voiced no concerns nor showed any signs of distress during injection. 

## 2018-09-27 ENCOUNTER — Other Ambulatory Visit: Payer: Self-pay | Admitting: Internal Medicine

## 2018-10-07 ENCOUNTER — Ambulatory Visit: Payer: PRIVATE HEALTH INSURANCE | Admitting: Internal Medicine

## 2018-10-07 ENCOUNTER — Encounter: Payer: Self-pay | Admitting: Internal Medicine

## 2018-10-07 VITALS — BP 134/78 | HR 83 | Temp 98.4°F | Resp 14 | Ht 75.0 in | Wt 260.2 lb

## 2018-10-07 DIAGNOSIS — R7303 Prediabetes: Secondary | ICD-10-CM

## 2018-10-07 DIAGNOSIS — F411 Generalized anxiety disorder: Secondary | ICD-10-CM

## 2018-10-07 DIAGNOSIS — K219 Gastro-esophageal reflux disease without esophagitis: Secondary | ICD-10-CM

## 2018-10-07 DIAGNOSIS — E538 Deficiency of other specified B group vitamins: Secondary | ICD-10-CM | POA: Diagnosis not present

## 2018-10-07 DIAGNOSIS — Z6837 Body mass index (BMI) 37.0-37.9, adult: Secondary | ICD-10-CM

## 2018-10-07 MED ORDER — ZOLPIDEM TARTRATE 10 MG PO TABS: 10.0000 mg | ORAL_TABLET | Freq: Every evening | ORAL | 5 refills | Status: DC | PRN

## 2018-10-07 MED ORDER — ALPRAZOLAM 1 MG PO TABS: 1.0000 mg | ORAL_TABLET | Freq: Every day | ORAL | 5 refills | Status: DC | PRN

## 2018-10-07 MED ORDER — TRAMADOL HCL 50 MG PO TABS: 50.0000 mg | ORAL_TABLET | Freq: Four times a day (QID) | ORAL | 3 refills | Status: DC | PRN

## 2018-10-07 MED ORDER — CYANOCOBALAMIN 1000 MCG/ML IJ SOLN
1000.0000 ug | Freq: Once | INTRAMUSCULAR | Status: AC
  Administered 2018-10-07: 1000 ug via INTRAMUSCULAR

## 2018-10-07 MED ORDER — ERGOCALCIFEROL 1.25 MG (50000 UT) PO CAPS: 50000.0000 [IU] | ORAL_CAPSULE | ORAL | 1 refills | Status: DC

## 2018-10-07 NOTE — Patient Instructions (Signed)
Switch your ranitidine to famotidine 20 mg dose     Available at [facility]  Can be used as needed

## 2018-10-07 NOTE — Progress Notes (Signed)
Subjective:  Patient ID: Bryan Galloway, male    DOB: 09/03/1966  Age: 52 y.o. MRN: [id number]  CC: The primary encounter diagnosis was B12 deficiency. Diagnoses of Class 2 severe obesity due to excess calories with serious comorbidity and body mass index (BMI) of 37.0 to 37.9 in adult [facility]), Anxiety, generalized, Prediabetes, and Gastroesophageal reflux disease without esophagitis were also pertinent to this visit.  HPI Bryan Galloway presents for follow up on GAD,  B12/D deficiencies,  and hypertension   He has lost 24 lbs since May by reducing his starches.  Reflux has improved as well.  Discussed switching to famotidine gicen the ranitidine recall.  Not using it daily ' Feels better with  12 monthly injections .  Better energy,   Chronically low Vitamin d.  Hypertension: patient checks blood pressure twice weekly at home.  Readings have been for the most part > 140/80 at rest . Patient is following a reduce salt diet most days and is taking medications as prescribed  Insomnia:  Patient alternates between ambien and alprazolam.  Uses alprazolam rarely for management of anxiety/stress brought on by work issues.  Does not mix with alcohol.  Does not use  Daily  bp elevated takes amlodipine 325 mg daiy in the am   Knee pain r>L using tramadol only once per week . Pain is aggravated by activity mobic stopped due to  elevated blood pressures. Not using NSAIDs  Snoring more but not sleepy during day and no apnea reported   Insomnia:  Patient alternates between ambien and alprazolam.  Uses alprazolam rarely for management of anxiety/stress brought on by work issues.  Does not mix with alcohol.  Does not use  Daily     Outpatient Medications Prior to Visit  Medication Sig Dispense Refill  . amLODipine (NORVASC) 10 MG tablet TAKE 1 TABLET DAILY 90 tablet 1  . losartan (COZAAR) 25 MG tablet TAKE ONE TABLET BY MOUTH EVERY DAY 90 tablet 1  . ranitidine (ZANTAC) 150 MG capsule Take 150  mg by mouth 2 (two) times daily.    Marland Kitchen ALPRAZolam (XANAX) 1 MG tablet Take 1 tablet (1 mg total) by mouth daily as needed. for anxiety 30 tablet 5  . ergocalciferol (DRISDOL) 50000 units capsule Take 1 capsule (50,000 Units total) by mouth once a week. 12 capsule 1  . traMADol (ULTRAM) 50 MG tablet Take 1 tablet (50 mg total) by mouth every 6 (six) hours as needed (chronic knee and ankle pain). 90 tablet 3  . zolpidem (AMBIEN) 10 MG tablet Take 1 tablet (10 mg total) by mouth at bedtime as needed for sleep. 30 tablet 5  . diphenoxylate-atropine (LOMOTIL) 2.5-0.025 MG tablet Take 1 tablet by mouth 4 (four) times daily as needed for diarrhea or loose stools. (Patient not taking: Reported on 10/07/2018) 30 tablet 0  . ondansetron (ZOFRAN ODT) 4 MG disintegrating tablet Take 1 tablet (4 mg total) by mouth every 8 (eight) hours as needed for nausea or vomiting. (Patient not taking: Reported on 10/07/2018) 20 tablet 0   Facility-Administered Medications Prior to Visit  Medication Dose Route Frequency Provider Last Rate Last Dose  . cyanocobalamin ((VITAMIN B-12)) injection 1,000 mcg  1,000 mcg Intramuscular Once [name], MD        Review of Systems;  Patient denies headache, fevers, malaise, unintentional weight loss, skin rash, eye pain, sinus congestion and sinus pain, sore throat, dysphagia,  hemoptysis , cough, dyspnea, wheezing, chest pain, palpitations, orthopnea, edema,  abdominal pain, nausea, melena, diarrhea, constipation, flank pain, dysuria, hematuria, urinary  Frequency, nocturia, numbness, tingling, seizures,  Focal weakness, Loss of consciousness,  Tremor, insomnia, depression, anxiety, and suicidal ideation.      Objective:  BP 134/78 (BP Location: Left Arm, Patient Position: Sitting, Cuff Size: Large)   Pulse 83   Temp 98.4 F (36.9 C) (Oral)   Resp 14   Ht 6\' 3"  (1.905 m)   Wt 260 lb 3.2 oz (118 kg)   SpO2 98%   BMI 32.52 kg/m   BP Readings from Last 3 Encounters:    10/07/18 134/78  06/18/18 122/78  04/01/18 120/76    Wt Readings from Last 3 Encounters:  10/07/18 260 lb 3.2 oz (118 kg)  06/18/18 276 lb 12.8 oz (125.6 kg)  04/01/18 284 lb (128.8 kg)    General appearance: alert, cooperative and appears stated age Ears: normal TM's and external ear canals both ears Throat: lips, mucosa, and tongue normal; teeth and gums normal Neck: no adenopathy, no carotid bruit, supple, symmetrical, trachea midline and thyroid not enlarged, symmetric, no tenderness/mass/nodules Back: symmetric, no curvature. ROM normal. No CVA tenderness. Lungs: clear to auscultation bilaterally Heart: regular rate and rhythm, S1, S2 normal, no murmur, click, rub or gallop Abdomen: soft, non-tender; bowel sounds normal; no masses,  no organomegaly Pulses: 2+ and symmetric Skin: Skin color, texture, turgor normal. No rashes or lesions Lymph nodes: Cervical, supraclavicular, and axillary nodes normal.  Lab Results  Component Value Date   HGBA1C 6.0 04/01/2018   HGBA1C 6.2 08/15/2017   HGBA1C 6.1 02/11/2017    Lab Results  Component Value Date   CREATININE 1.42 06/18/2018   CREATININE 1.19 04/01/2018   CREATININE 1.08 08/15/2017    Lab Results  Component Value Date   WBC 7.8 06/18/2018   HGB 17.1 (H) 06/18/2018   HCT 50.0 06/18/2018   PLT 230.0 06/18/2018   GLUCOSE 120 (H) 06/18/2018   CHOL 184 04/01/2018   TRIG 224.0 (H) 04/01/2018   HDL 42.20 04/01/2018   LDLDIRECT 110.0 04/01/2018   LDLCALC 99 08/15/2017   ALT 52 06/18/2018   AST 47 (H) 06/18/2018   NA 137 06/18/2018   K 3.7 06/18/2018   CL 104 06/18/2018   CREATININE 1.42 06/18/2018   BUN 18 06/18/2018   CO2 22 06/18/2018   TSH 1.07 02/11/2017   PSA 0.69 09/18/2017   HGBA1C 6.0 04/01/2018   MICROALBUR 2.0 (H) 02/11/2017    Dg Chest 2 View  Result Date: 12/26/2017 CLINICAL DATA:  Patient states that he has had congestion and a dry cough for 6weeks. HTN. Nonsmoker. EXAM: CHEST  2 VIEW  COMPARISON:  None. FINDINGS: Heart size is normal. There is mild perihilar peribronchial thickening. There are no focal consolidations or pleural effusions. IMPRESSION: Mild bronchitic thickening. Electronically Signed   By: Nolon Nations M.D.   On: 12/26/2017 12:17    Assessment & Plan:   Problem List Items Addressed This Visit    Anxiety, generalized    I have refilled the alprazolam for prn use.  The risks and benefits of benzodiazepine use were reviewed  with patient today including excessive sedation leading to respiratory depression,  impaired thinking/driving, and addiction.  Patient was advised to avoid concurrent use with alcohol, to use medication only as needed and not to share with others  .       Relevant Medications   ALPRAZolam (XANAX) 1 MG tablet   B12 deficiency - Primary    managed with  injections by preference Folate and intrinsic factor ab are normal .      Relevant Medications   cyanocobalamin ((VITAMIN B-12)) injection 1,000 mcg (Completed)   GERD (gastroesophageal reflux disease)    Managed with prn ranitidine  Now with famotidine       Obesity    I have congratulated him in reduction of   BMI and encouraged  Continued weight loss with goal of 10% of body weigh over the next 6 months using a low glycemic index diet and regular exercise a minimum of 5 days per week.        Prediabetes    Secondary to obesity and lack of exercise,  Mediterranean diet reviewed,  Exercise regiemens discussed. A1c has improved.  Lab Results  Component Value Date   HGBA1C 6.0 04/01/2018          [id number]  I have discontinued Edman Lipsey. Scalzo's ondansetron and diphenoxylate-atropine. I have also changed his ergocalciferol. Additionally, I am having him maintain his ranitidine, losartan, amLODipine, ALPRAZolam, traMADol, and zolpidem. We administered cyanocobalamin. We will continue to administer cyanocobalamin.  Meds ordered this encounter  Medications  . cyanocobalamin  ((VITAMIN B-12)) injection 1,000 mcg  . ALPRAZolam (XANAX) 1 MG tablet    Sig: Take 1 tablet (1 mg total) by mouth daily as needed. for anxiety    Dispense:  30 tablet    Refill:  5  . traMADol (ULTRAM) 50 MG tablet    Sig: Take 1 tablet (50 mg total) by mouth every 6 (six) hours as needed (chronic knee and ankle pain).    Dispense:  90 tablet    Refill:  3  . zolpidem (AMBIEN) 10 MG tablet    Sig: Take 1 tablet (10 mg total) by mouth at bedtime as needed for sleep.    Dispense:  30 tablet    Refill:  5  . ergocalciferol (DRISDOL) 1.25 MG (50000 UT) capsule    Sig: Take 1 capsule (50,000 Units total) by mouth once a week.    Dispense:  12 capsule    Refill:  1    Medications Discontinued During This Encounter  Medication Reason  . diphenoxylate-atropine (LOMOTIL) 2.5-0.025 MG tablet Error  . ondansetron (ZOFRAN ODT) 4 MG disintegrating tablet Error  . ALPRAZolam (XANAX) 1 MG tablet Reorder  . traMADol (ULTRAM) 50 MG tablet Reorder  . zolpidem (AMBIEN) 10 MG tablet Reorder  . ergocalciferol (DRISDOL) 50000 units capsule Reorder    Follow-up: Return in about 6 months (around 04/07/2019).   [name], MD

## 2018-10-08 ENCOUNTER — Ambulatory Visit: Payer: PRIVATE HEALTH INSURANCE

## 2018-10-08 DIAGNOSIS — K219 Gastro-esophageal reflux disease without esophagitis: Secondary | ICD-10-CM | POA: Insufficient documentation

## 2018-10-08 NOTE — Assessment & Plan Note (Signed)
I have congratulated him in reduction of   BMI and encouraged  Continued weight loss with goal of 10% of body weigh over the next 6 months using a low glycemic index diet and regular exercise a minimum of 5 days per week.   

## 2018-10-08 NOTE — Assessment & Plan Note (Signed)
Secondary to obesity and lack of exercise,  Mediterranean diet reviewed,  Exercise regiemens discussed. A1c has improved.  Lab Results  Component Value Date   HGBA1C 6.0 04/01/2018    

## 2018-10-08 NOTE — Assessment & Plan Note (Signed)
I have refilled the alprazolam for prn use.  The risks and benefits of benzodiazepine use were reviewed  with patient today including excessive sedation leading to respiratory depression,  impaired thinking/driving, and addiction.  Patient was advised to avoid concurrent use with alcohol, to use medication only as needed and not to share with others  .

## 2018-10-08 NOTE — Assessment & Plan Note (Signed)
managed with injections by preference Folate and intrinsic factor ab are normal .

## 2018-10-08 NOTE — Assessment & Plan Note (Signed)
Managed with prn ranitidine  Now with famotidine

## 2018-11-11 ENCOUNTER — Ambulatory Visit (INDEPENDENT_AMBULATORY_CARE_PROVIDER_SITE_OTHER): Payer: PRIVATE HEALTH INSURANCE

## 2018-11-11 DIAGNOSIS — E538 Deficiency of other specified B group vitamins: Secondary | ICD-10-CM

## 2018-11-11 MED ORDER — CYANOCOBALAMIN 1000 MCG/ML IJ SOLN
1000.0000 ug | Freq: Once | INTRAMUSCULAR | Status: AC
  Administered 2018-11-11: 1000 ug via INTRAMUSCULAR

## 2018-11-11 NOTE — Progress Notes (Signed)
Pt was seen today for B-12 shot given IM in the RD. Pt tolerated well.  

## 2018-12-17 ENCOUNTER — Ambulatory Visit (INDEPENDENT_AMBULATORY_CARE_PROVIDER_SITE_OTHER): Payer: PRIVATE HEALTH INSURANCE

## 2018-12-17 DIAGNOSIS — E538 Deficiency of other specified B group vitamins: Secondary | ICD-10-CM | POA: Diagnosis not present

## 2018-12-17 MED ORDER — CYANOCOBALAMIN 1000 MCG/ML IJ SOLN
1000.0000 ug | Freq: Once | INTRAMUSCULAR | Status: AC
  Administered 2018-12-17: 1000 ug via INTRAMUSCULAR

## 2018-12-17 NOTE — Progress Notes (Signed)
Pt came in today for B12 injection. Given in L deltoid. Tolerated well, no complaints or concerns at this time.

## 2018-12-21 NOTE — Progress Notes (Signed)
  I have reviewed the above information and agree with above.   Ciin Brazzel, MD 

## 2018-12-25 ENCOUNTER — Other Ambulatory Visit: Payer: Self-pay | Admitting: Internal Medicine

## 2018-12-25 MED ORDER — AMOXICILLIN-POT CLAVULANATE 875-125 MG PO TABS: 1.0000 | ORAL_TABLET | Freq: Two times a day (BID) | ORAL | 0 refills | Status: DC

## 2018-12-25 MED ORDER — HYDROCOD POLST-CPM POLST ER 10-8 MG/5ML PO SUER: 5.0000 mL | Freq: Every evening | ORAL | 0 refills | Status: DC | PRN

## 2018-12-25 MED ORDER — PREDNISONE 10 MG PO TABS: ORAL_TABLET | ORAL | 0 refills | Status: DC

## 2019-01-20 ENCOUNTER — Ambulatory Visit (INDEPENDENT_AMBULATORY_CARE_PROVIDER_SITE_OTHER): Payer: PRIVATE HEALTH INSURANCE

## 2019-01-20 ENCOUNTER — Other Ambulatory Visit: Payer: Self-pay | Admitting: Internal Medicine

## 2019-01-20 DIAGNOSIS — E538 Deficiency of other specified B group vitamins: Secondary | ICD-10-CM | POA: Diagnosis not present

## 2019-01-20 NOTE — Progress Notes (Signed)
Patient presented for B 12 injection to left deltoid, patient voiced no concerns nor showed any signs of distress during injection. 

## 2019-01-28 NOTE — Progress Notes (Signed)
  I have reviewed the above information and agree with above.   Teresa Tullo, MD 

## 2019-02-19 ENCOUNTER — Ambulatory Visit: Payer: PRIVATE HEALTH INSURANCE

## 2019-03-05 ENCOUNTER — Ambulatory Visit (INDEPENDENT_AMBULATORY_CARE_PROVIDER_SITE_OTHER): Payer: PRIVATE HEALTH INSURANCE

## 2019-03-05 ENCOUNTER — Other Ambulatory Visit: Payer: Self-pay

## 2019-03-05 DIAGNOSIS — E538 Deficiency of other specified B group vitamins: Secondary | ICD-10-CM

## 2019-03-05 MED ORDER — CYANOCOBALAMIN 1000 MCG/ML IJ SOLN
1000.0000 ug | Freq: Once | INTRAMUSCULAR | Status: AC
  Administered 2019-03-05: 13:00:00 1000 ug via INTRAMUSCULAR

## 2019-03-05 NOTE — Progress Notes (Signed)
Bryan Galloway presents today for injection per MD orders. B12 injection administered IM in right Upper Arm. Administration without incident. Patient tolerated well.  Nina,cma   

## 2019-03-28 IMAGING — CR DG CHEST 2V
1 series · 2 of 2 positions shown · non-contrast
Comparison: None.

CLINICAL DATA: Patient states that he has had congestion and a dry
cough for 7weeks. HTN. Nonsmoker.

EXAM:
CHEST  2 VIEW

[Series 1: dg chest 2 view · 0.14mm/px · 2 of 2 slices shown]
[im 1/2]
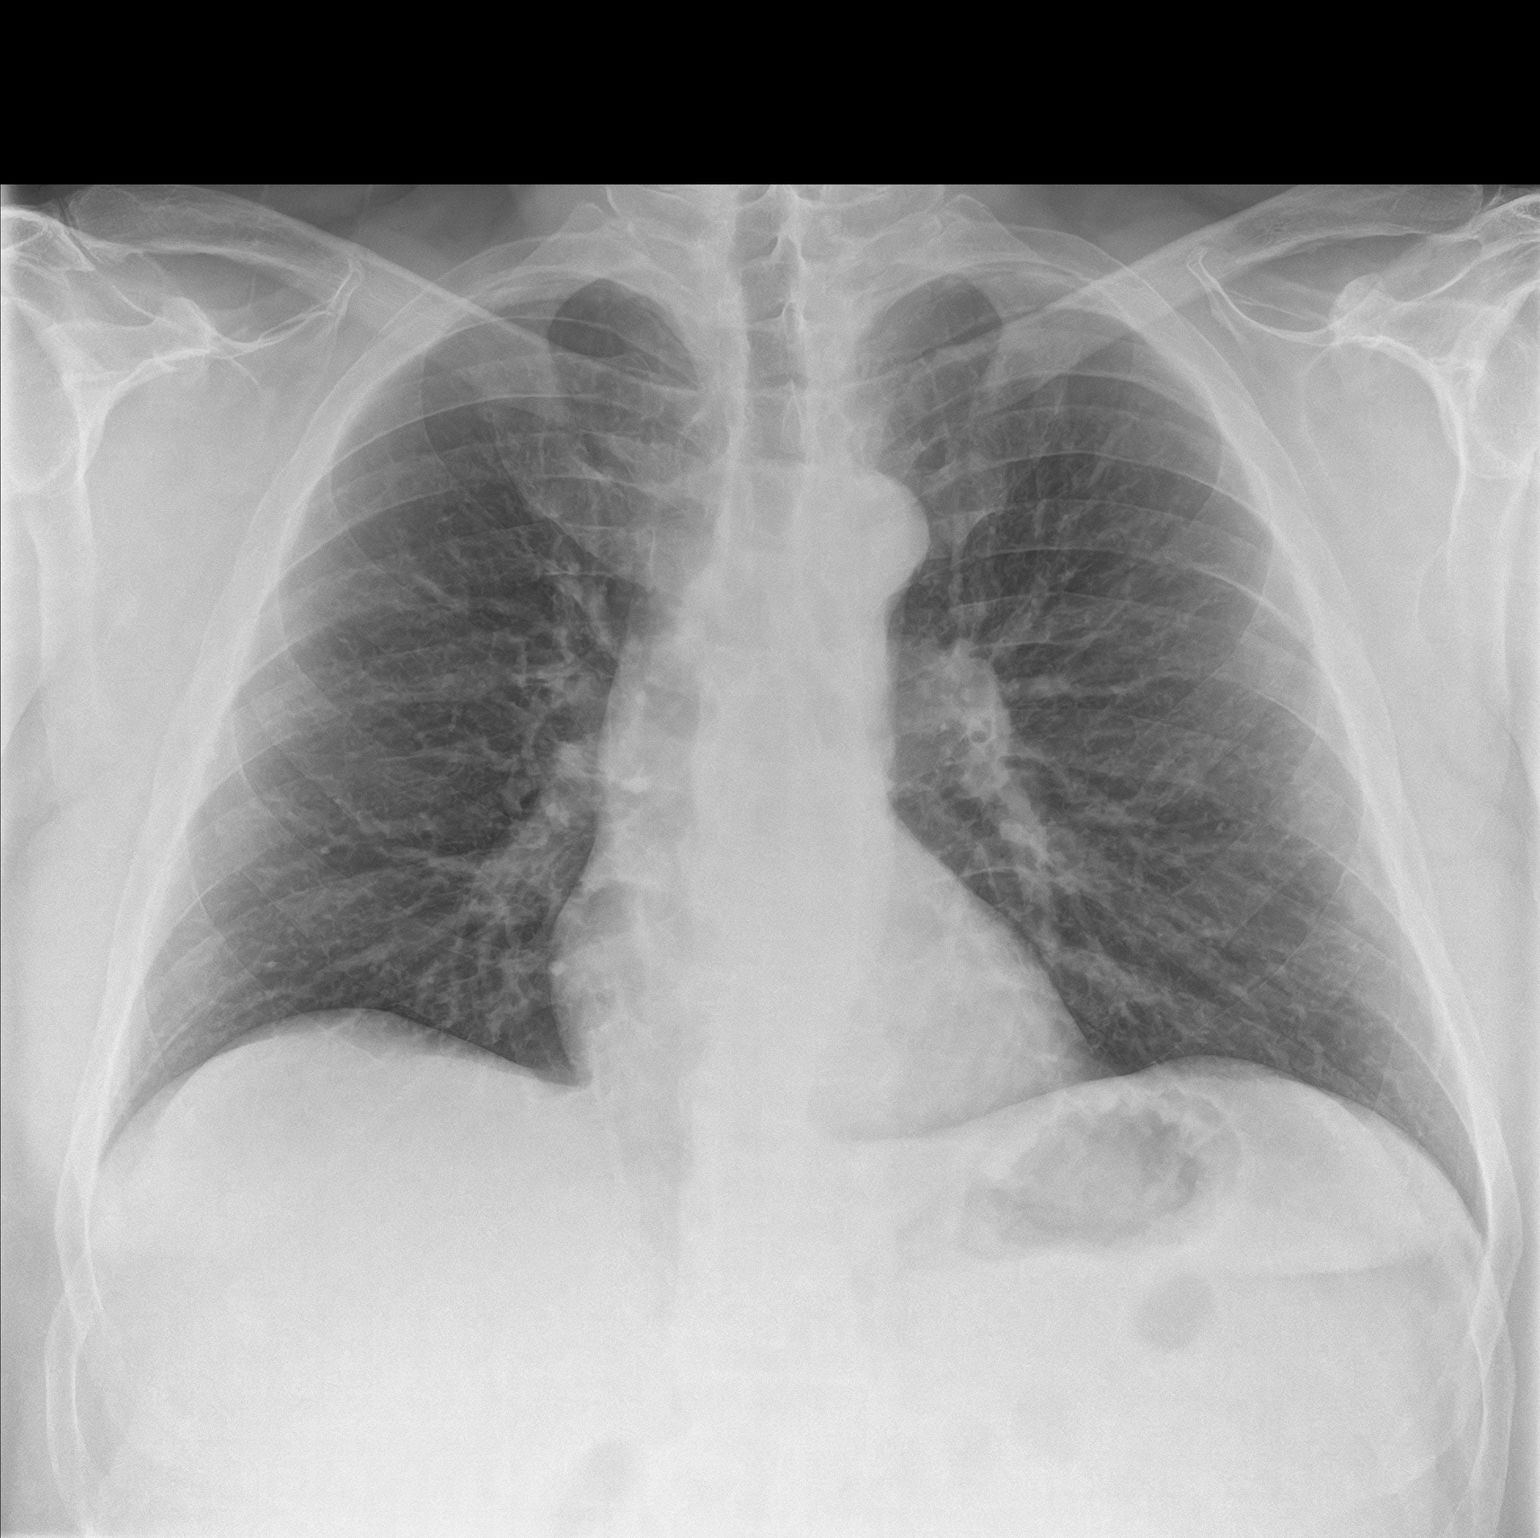
[im 2/2]
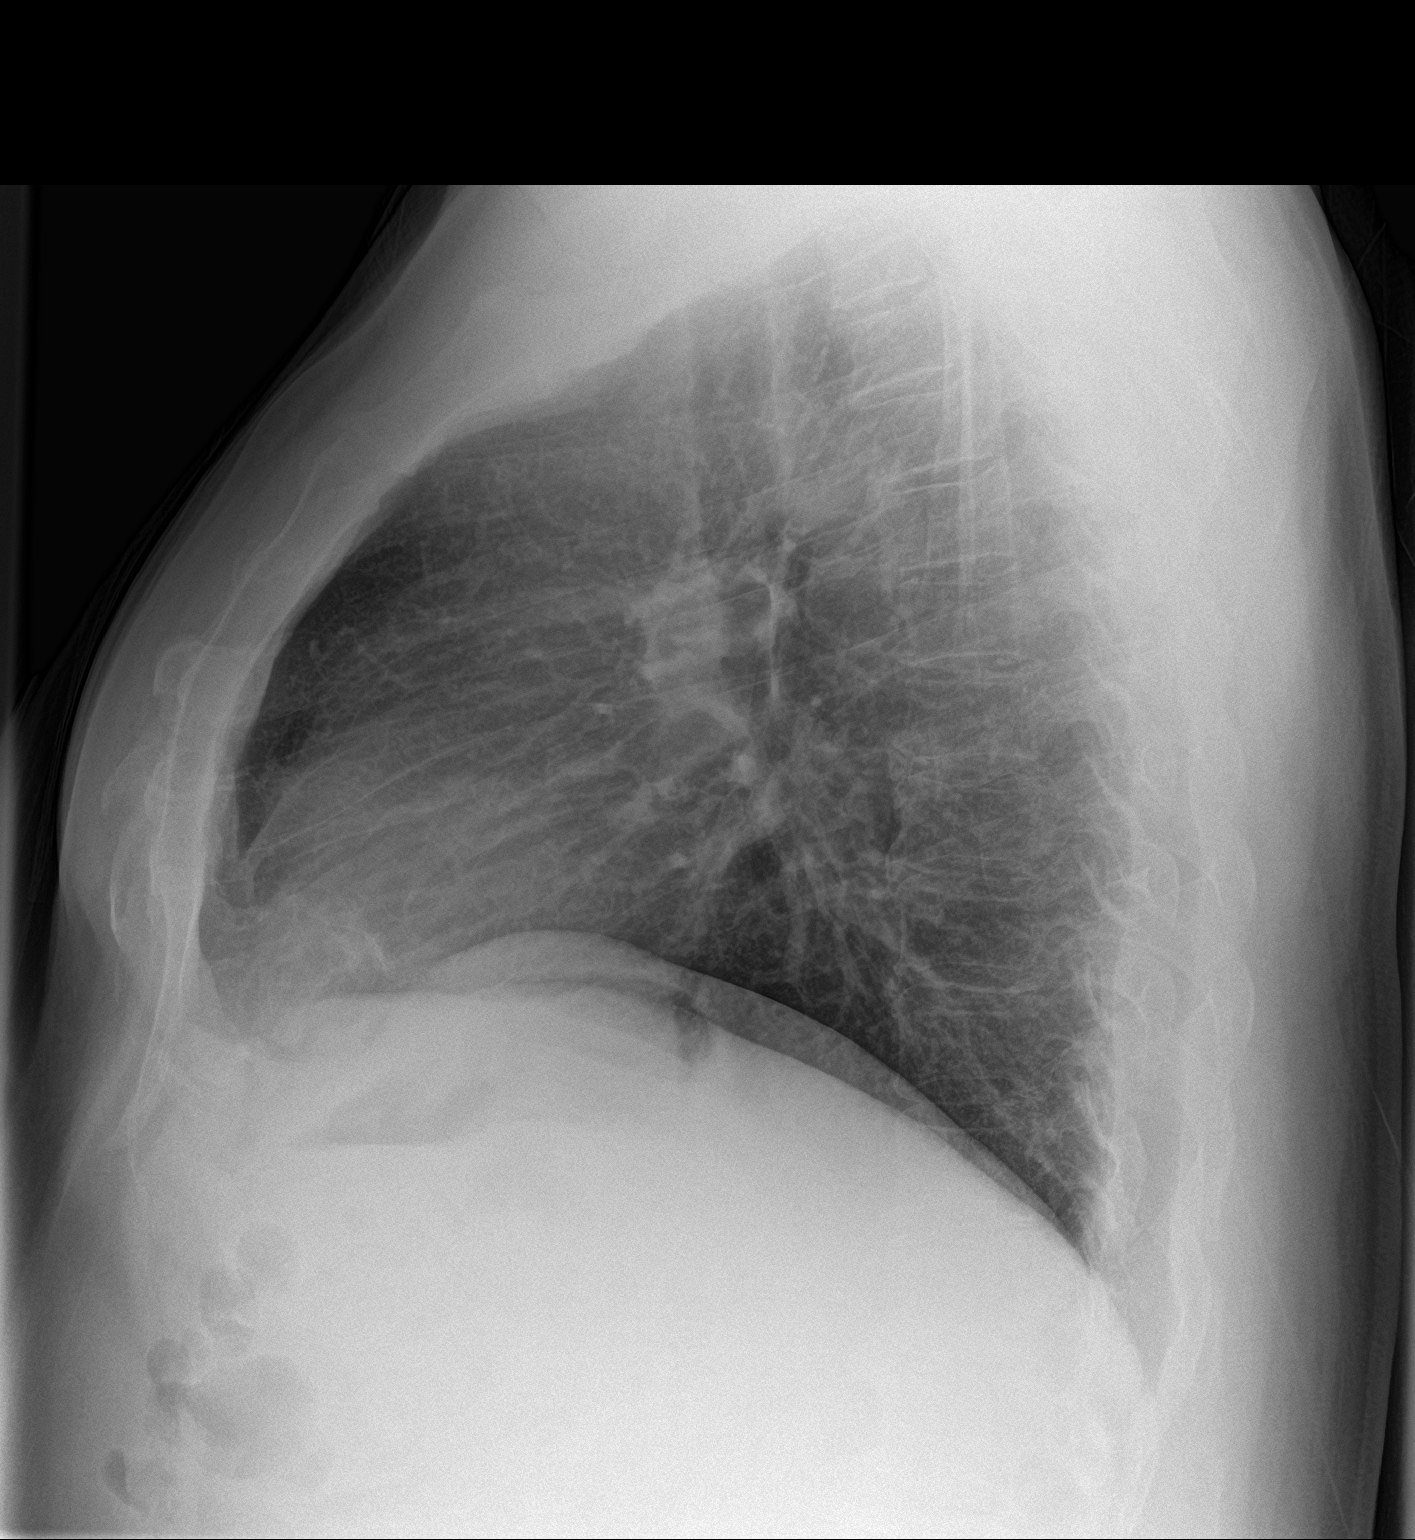

[2 of 2 positions shown; findings below may reference images not displayed]

FINDINGS: Heart size is normal. There is mild perihilar peribronchial
thickening. There are no focal consolidations or pleural effusions.
IMPRESSION: Mild bronchitic thickening.

## 2019-04-08 ENCOUNTER — Ambulatory Visit (INDEPENDENT_AMBULATORY_CARE_PROVIDER_SITE_OTHER): Payer: PRIVATE HEALTH INSURANCE

## 2019-04-08 ENCOUNTER — Other Ambulatory Visit: Payer: Self-pay

## 2019-04-08 DIAGNOSIS — E538 Deficiency of other specified B group vitamins: Secondary | ICD-10-CM | POA: Diagnosis not present

## 2019-04-08 MED ORDER — CYANOCOBALAMIN 1000 MCG/ML IJ SOLN
1000.0000 ug | Freq: Once | INTRAMUSCULAR | Status: AC
  Administered 2019-04-08: 1000 ug via INTRAMUSCULAR

## 2019-04-08 NOTE — Progress Notes (Signed)
Chritopher K Koo presents today for injection per MD orders. B12 injection administered IM in left Upper Arm. Administration without incident. Patient tolerated well.  Nina,cma   

## 2019-05-12 ENCOUNTER — Ambulatory Visit: Payer: PRIVATE HEALTH INSURANCE

## 2019-05-13 ENCOUNTER — Other Ambulatory Visit: Payer: Self-pay

## 2019-05-13 ENCOUNTER — Other Ambulatory Visit: Payer: Self-pay | Admitting: Internal Medicine

## 2019-05-13 ENCOUNTER — Ambulatory Visit (INDEPENDENT_AMBULATORY_CARE_PROVIDER_SITE_OTHER): Payer: PRIVATE HEALTH INSURANCE

## 2019-05-13 DIAGNOSIS — E538 Deficiency of other specified B group vitamins: Secondary | ICD-10-CM

## 2019-05-13 MED ORDER — CYANOCOBALAMIN 1000 MCG/ML IJ SOLN
1000.0000 ug | Freq: Once | INTRAMUSCULAR | Status: AC
  Administered 2019-05-13: 1000 ug via INTRAMUSCULAR

## 2019-05-13 NOTE — Progress Notes (Signed)
Patient presented today for B12 injection per MD order.  Administered IM in the right deltoid without incident.  Patient tolerated well with no signs of distress.

## 2019-05-14 NOTE — Telephone Encounter (Signed)
Refilled: 10/07/2018 Last OV: 10/07/2018 Next OV: not scheduled

## 2019-05-15 NOTE — Telephone Encounter (Signed)
Refill for 30 days only.  OFFICE VISIT NEEDED prior to any more refills due to controlled substance

## 2019-05-27 ENCOUNTER — Ambulatory Visit (INDEPENDENT_AMBULATORY_CARE_PROVIDER_SITE_OTHER): Payer: PRIVATE HEALTH INSURANCE | Admitting: Internal Medicine

## 2019-05-27 ENCOUNTER — Other Ambulatory Visit: Payer: Self-pay

## 2019-05-27 ENCOUNTER — Encounter: Payer: Self-pay | Admitting: Internal Medicine

## 2019-05-27 DIAGNOSIS — Z6837 Body mass index (BMI) 37.0-37.9, adult: Secondary | ICD-10-CM

## 2019-05-27 DIAGNOSIS — E559 Vitamin D deficiency, unspecified: Secondary | ICD-10-CM

## 2019-05-27 DIAGNOSIS — I10 Essential (primary) hypertension: Secondary | ICD-10-CM | POA: Diagnosis not present

## 2019-05-27 DIAGNOSIS — F5101 Primary insomnia: Secondary | ICD-10-CM

## 2019-05-27 DIAGNOSIS — R7303 Prediabetes: Secondary | ICD-10-CM

## 2019-05-27 DIAGNOSIS — Z125 Encounter for screening for malignant neoplasm of prostate: Secondary | ICD-10-CM

## 2019-05-27 DIAGNOSIS — E781 Pure hyperglyceridemia: Secondary | ICD-10-CM | POA: Diagnosis not present

## 2019-05-27 DIAGNOSIS — E538 Deficiency of other specified B group vitamins: Secondary | ICD-10-CM

## 2019-05-27 MED ORDER — LOSARTAN POTASSIUM 25 MG PO TABS: 25.0000 mg | ORAL_TABLET | Freq: Every day | ORAL | 1 refills | Status: DC

## 2019-05-27 MED ORDER — ZOLPIDEM TARTRATE 10 MG PO TABS: 10.0000 mg | ORAL_TABLET | Freq: Every evening | ORAL | 5 refills | Status: DC | PRN

## 2019-05-27 MED ORDER — ALPRAZOLAM 1 MG PO TABS: 1.0000 mg | ORAL_TABLET | Freq: Every day | ORAL | 5 refills | Status: DC | PRN

## 2019-05-27 MED ORDER — AMLODIPINE BESYLATE 10 MG PO TABS: 10.0000 mg | ORAL_TABLET | Freq: Every day | ORAL | 1 refills | Status: DC

## 2019-05-27 NOTE — Patient Instructions (Signed)
I have refilled all of your medications for 6 months   Please start taking your blood pressure medication at night,  A recent well designed study confirmed that nighttime dosing of blood pressure medication reduces your risk of heart attacks and strokes   You will get PSA,  Vit D and fasting labs on July 22 Janett Billow will call you to set up the appointment

## 2019-05-27 NOTE — Progress Notes (Signed)
Virtual Visit via [email or web address]  This visit type was conducted due to national recommendations for restrictions regarding the COVID-19 pandemic (e.g. social distancing).  This format is felt to be most appropriate for this patient at this time.  All issues noted in this document were discussed and addressed.  No physical exam was performed (except for noted visual exam findings with Video Visits).   I connected with@ on 05/27/19 at 11:00 AM EDT by a video enabled telemedicine application or telephone and verified that I am speaking with the correct person using two identifiers. Location patient: home Location provider: work or home office Persons participating in the virtual visit: patient, provider  I discussed the limitations, risks, security and privacy concerns of performing an evaluation and management service by telephone and the availability of in person appointments. I also discussed with the patient that there may be a patient responsible charge related to this service. The patient expressed understanding and agreed to proceed.  Reason for visit: follow up on hypertension, obesiy and other issues   HPI:  53 yr old male with b12 deficiency , prediabetes, hypertension and chronic insomnia    Prediabetes/obesity :  Patient does not check blood sugars  Not exercising on a regular basis or trying to lose any more  weight. He has not not gained any recent weight,  Patient voices awareness  of the foods he/she needs to avoid,  And follows a low GI diet about 75% of the time.    Denies numbness and tingling in lower extremities.  Denies hypoglycemic symptoms.    Hypertension: patient checks blood pressure twice weekly at home.  Readings have been for the most part < 140/80 at rest . Patient is following a reduced salt diet most days and is taking medications as prescribed  The patient has no signs or symptoms of COVID 19 infection (fever, cough, sore throat  or shortness of breath beyond what is  typical for patient).  Patient denies contact with other persons with the above mentioned symptoms or with anyone confirmed to have COVID 19   Insomnia:  chronic, with no improvement using over-the-counter first generation antihistamines. Reviewed principles of good sleep hygiene. Continue to require use of nightly ambien .  No adverse effects noted.   ROS: See pertinent positives and negatives per HPI.  Past Medical History:  Diagnosis Date  . Nephrolithiasis     Past Surgical History:  Procedure Laterality Date  . JOINT REPLACEMENT     bilateral hips  [facility]    Family History  Problem Relation Age of Onset  . Cancer Mother 52  . Heart disease Father 59    SOCIAL HX:  reports that he has never smoked. He has never used smokeless tobacco. He reports that he does not drink alcohol or use drugs.   Current Outpatient Medications:  .  ALPRAZolam (XANAX) 1 MG tablet, Take 1 tablet (1 mg total) by mouth daily as needed. for anxiety, Disp: 30 tablet, Rfl: 5 .  amLODipine (NORVASC) 10 MG tablet, Take 1 tablet (10 mg total) by mouth daily., Disp: 90 tablet, Rfl: 1 .  losartan (COZAAR) 25 MG tablet, Take 1 tablet (25 mg total) by mouth at bedtime., Disp: 90 tablet, Rfl: 1 .  traMADol (ULTRAM) 50 MG tablet, Take 1 tablet (50 mg total) by mouth 3 (three) times daily as needed., Disp: 90 tablet, Rfl: 0 .  Vitamin D, Ergocalciferol, (DRISDOL) 1.25 MG (50000 UT) CAPS capsule, TAKE ONE CAPSULE EACH WEEK,  Disp: 12 capsule, Rfl: 1 .  zolpidem (AMBIEN) 10 MG tablet, Take 1 tablet (10 mg total) by mouth at bedtime as needed for sleep., Disp: 30 tablet, Rfl: 5  EXAM:  VITALS per patient if applicable:  GENERAL: alert, oriented, appears well and in no acute distress  HEENT: atraumatic, conjunttiva clear, no obvious abnormalities on inspection of external nose and ears  NECK: normal movements of the head and neck  LUNGS: on inspection no signs of respiratory distress, breathing  rate appears normal, no obvious gross SOB, gasping or wheezing  CV: no obvious cyanosis  MS: moves all visible extremities without noticeable abnormality  PSYCH/NEURO: pleasant and cooperative, no obvious depression or anxiety, speech and thought processing grossly intact  ASSESSMENT AND PLAN:  Discussed the following assessment and plan:   B12 deficiency Managed with monthly injections since 2018  Lab Results  Component Value Date   VITAMINB12 246 08/20/2017     Class 2 severe obesity due to excess calories with serious comorbidity and body mass index (BMI) of 37.0 to 37.9 in adult [facility]) He lost 20 lbs last year and states that his weight has plateau ed, partly due to complacency and lack of regular exercise.  I have congratulated him  in reduction of   BMI and encouraged  Continued weight loss with goal of 10% of body weigh over the next 6 months using a low glycemic index diet and regular exercise a minimum of 5 days per week.    Prediabetes Secondary to obesity and lack of exercise,  Mediterranean diet reviewed,  Exercise regiemens discussed. A1c has improved.  Lab Results  Component Value Date   HGBA1C 6.0 04/01/2018     Insomnia Managed with Lorrin Mais.  The risks and benefits of ambien use were reviewed with patient today including excessive sedation leading to respiratory depression,  impaired thinking/driving, and addiction.  Patient was advised to avoid concurrent use with alcohol, to use medication only as needed, not to exceed 10 mg and  and not to share with others. Refills given.       I discussed the assessment and treatment plan with the patient. The patient was provided an opportunity to ask questions and all were answered. The patient agreed with the plan and demonstrated an understanding of the instructions.   The patient was advised to call back or seek an in-person evaluation if the symptoms worsen or if the condition fails to improve as anticipated.  I  provided 25 minutes of non-face-to-face time during this encounter.   [name], MD

## 2019-05-28 NOTE — Assessment & Plan Note (Signed)
He lost 20 lbs last year and states that his weight has plateau ed, partly due to complacency and lack of regular exercise.  I have congratulated him  in reduction of   BMI and encouraged  Continued weight loss with goal of 10% of body weigh over the next 6 months using a low glycemic index diet and regular exercise a minimum of 5 days per week.

## 2019-05-28 NOTE — Assessment & Plan Note (Addendum)
Managed with monthly injections since 2018  Lab Results  Component Value Date   VITAMINB12 246 08/20/2017

## 2019-05-28 NOTE — Assessment & Plan Note (Signed)
Managed with ambien.  The risks and benefits of ambien use were reviewed with patient today including excessive sedation leading to respiratory depression,  impaired thinking/driving, and addiction.  Patient was advised to avoid concurrent use with alcohol, to use medication only as needed, not to exceed 10 mg and  and not to share with others. Refills given.

## 2019-05-28 NOTE — Assessment & Plan Note (Signed)
Secondary to obesity and lack of exercise,  Mediterranean diet reviewed,  Exercise regiemens discussed. A1c has improved.  Lab Results  Component Value Date   HGBA1C 6.0 04/01/2018

## 2019-06-17 ENCOUNTER — Ambulatory Visit (INDEPENDENT_AMBULATORY_CARE_PROVIDER_SITE_OTHER): Payer: PRIVATE HEALTH INSURANCE

## 2019-06-17 ENCOUNTER — Other Ambulatory Visit: Payer: Self-pay

## 2019-06-17 ENCOUNTER — Other Ambulatory Visit (INDEPENDENT_AMBULATORY_CARE_PROVIDER_SITE_OTHER): Payer: PRIVATE HEALTH INSURANCE

## 2019-06-17 DIAGNOSIS — E781 Pure hyperglyceridemia: Secondary | ICD-10-CM

## 2019-06-17 DIAGNOSIS — I10 Essential (primary) hypertension: Secondary | ICD-10-CM | POA: Diagnosis not present

## 2019-06-17 DIAGNOSIS — Z125 Encounter for screening for malignant neoplasm of prostate: Secondary | ICD-10-CM

## 2019-06-17 DIAGNOSIS — E559 Vitamin D deficiency, unspecified: Secondary | ICD-10-CM

## 2019-06-17 DIAGNOSIS — R7303 Prediabetes: Secondary | ICD-10-CM | POA: Diagnosis not present

## 2019-06-17 DIAGNOSIS — E538 Deficiency of other specified B group vitamins: Secondary | ICD-10-CM | POA: Diagnosis not present

## 2019-06-17 MED ORDER — CYANOCOBALAMIN 1000 MCG/ML IJ SOLN
1000.0000 ug | Freq: Once | INTRAMUSCULAR | Status: AC
  Administered 2019-06-17: 1000 ug via INTRAMUSCULAR

## 2019-06-17 NOTE — Progress Notes (Signed)
Patient presented today for B12 injection.  Administered IM in left deltoid.  Patient tolerated well with no signs of distress.   

## 2019-06-18 LAB — COMPREHENSIVE METABOLIC PANEL
ALT: 18 U/L (ref 0–53)
AST: 17 U/L (ref 0–37)
Albumin: 4.6 g/dL (ref 3.5–5.2)
Alkaline Phosphatase: 47 U/L (ref 39–117)
BUN: 12 mg/dL (ref 6–23)
CO2: 25 mEq/L (ref 19–32)
Calcium: 9.5 mg/dL (ref 8.4–10.5)
Chloride: 105 mEq/L (ref 96–112)
Creatinine, Ser: 1.05 mg/dL (ref 0.40–1.50)
GFR: 73.22 mL/min (ref >60.00)
Glucose, Bld: 90 mg/dL (ref 70–99)
Potassium: 3.6 mEq/L (ref 3.5–5.1)
Sodium: 139 mEq/L (ref 135–145)
Total Bilirubin: 0.7 mg/dL (ref 0.2–1.2)
Total Protein: 6.6 g/dL (ref 6.0–8.3)

## 2019-06-18 LAB — PSA: PSA: 0.4 ng/mL (ref 0.10–4.00)

## 2019-06-18 LAB — LIPID PANEL
Cholesterol: 176 mg/dL (ref 0–200)
HDL: 40.5 mg/dL (ref >39.00)
LDL Cholesterol: 101 mg/dL — ABNORMAL HIGH (ref 0–99)
NonHDL: 135.82
Total CHOL/HDL Ratio: 4
Triglycerides: 175 mg/dL — ABNORMAL HIGH (ref 0.0–149.0)
VLDL: 35 mg/dL (ref 0.0–40.0)

## 2019-06-18 LAB — HEMOGLOBIN A1C: Hgb A1c MFr Bld: 6.1 % (ref 4.6–6.5)

## 2019-06-18 LAB — VITAMIN D 25 HYDROXY (VIT D DEFICIENCY, FRACTURES): VITD: 27.11 ng/mL — ABNORMAL LOW (ref 30.00–100.00)

## 2019-07-21 ENCOUNTER — Other Ambulatory Visit: Payer: Self-pay

## 2019-07-21 ENCOUNTER — Ambulatory Visit (INDEPENDENT_AMBULATORY_CARE_PROVIDER_SITE_OTHER): Payer: PRIVATE HEALTH INSURANCE

## 2019-07-21 DIAGNOSIS — E538 Deficiency of other specified B group vitamins: Secondary | ICD-10-CM

## 2019-07-21 MED ORDER — CYANOCOBALAMIN 1000 MCG/ML IJ SOLN
1000.0000 ug | Freq: Once | INTRAMUSCULAR | Status: AC
  Administered 2019-07-21: 16:00:00 1000 ug via INTRAMUSCULAR

## 2019-07-21 NOTE — Progress Notes (Signed)
Patient presented today for B12 injection.  Administered IM in right deltoid.  Patient tolerated well with no signs of distress.   

## 2019-07-22 ENCOUNTER — Other Ambulatory Visit: Payer: Self-pay | Admitting: Internal Medicine

## 2019-07-27 ENCOUNTER — Other Ambulatory Visit: Payer: Self-pay | Admitting: Internal Medicine

## 2019-07-28 NOTE — Telephone Encounter (Signed)
Refilled: 05/15/2019 Last OV: 05/27/2019 Next OV:  Not scheduled

## 2019-08-24 ENCOUNTER — Ambulatory Visit: Payer: PRIVATE HEALTH INSURANCE

## 2019-09-01 ENCOUNTER — Telehealth: Payer: Self-pay

## 2019-09-01 ENCOUNTER — Ambulatory Visit (INDEPENDENT_AMBULATORY_CARE_PROVIDER_SITE_OTHER): Payer: PRIVATE HEALTH INSURANCE

## 2019-09-01 ENCOUNTER — Other Ambulatory Visit: Payer: Self-pay

## 2019-09-01 DIAGNOSIS — E538 Deficiency of other specified B group vitamins: Secondary | ICD-10-CM | POA: Diagnosis not present

## 2019-09-01 MED ORDER — CYANOCOBALAMIN 1000 MCG/ML IJ SOLN
1000.0000 ug | Freq: Once | INTRAMUSCULAR | Status: AC
  Administered 2019-09-01: 10:00:00 1000 ug via INTRAMUSCULAR

## 2019-09-01 MED ORDER — DICLOFENAC SODIUM 1 % TD GEL: 2.0000 g | Freq: Four times a day (QID) | TRANSDERMAL | 3 refills | Status: DC

## 2019-09-01 NOTE — Progress Notes (Signed)
Bryan Galloway presents today for injection per MD orders. B12 injection administered IM in left Upper Arm. Administration without incident. Patient tolerated well.  Nina,cma   

## 2019-09-01 NOTE — Telephone Encounter (Signed)
voltaren gel sent.  Please notify patient

## 2019-09-02 NOTE — Telephone Encounter (Signed)
Called and informed patient that medication was sent to pharmacy.  Nina,cma

## 2019-09-21 ENCOUNTER — Other Ambulatory Visit: Payer: Self-pay | Admitting: Internal Medicine

## 2019-10-06 ENCOUNTER — Other Ambulatory Visit: Payer: Self-pay

## 2019-10-06 ENCOUNTER — Ambulatory Visit (INDEPENDENT_AMBULATORY_CARE_PROVIDER_SITE_OTHER): Payer: PRIVATE HEALTH INSURANCE | Admitting: *Deleted

## 2019-10-06 DIAGNOSIS — E538 Deficiency of other specified B group vitamins: Secondary | ICD-10-CM

## 2019-10-06 MED ORDER — CYANOCOBALAMIN 1000 MCG/ML IJ SOLN
1000.0000 ug | Freq: Once | INTRAMUSCULAR | Status: AC
  Administered 2019-10-06: 1000 ug via INTRAMUSCULAR

## 2019-10-06 NOTE — Progress Notes (Addendum)
Patient presented for B 12 injection to right deltoid, patient voiced no concerns nor showed any signs of distress during injection.    I have reviewed the above information and agree with above.   Deborra Medina, MD

## 2019-10-20 ENCOUNTER — Telehealth: Payer: Self-pay | Admitting: Internal Medicine

## 2019-10-20 ENCOUNTER — Telehealth: Payer: Self-pay

## 2019-10-20 MED ORDER — DICLOFENAC SODIUM 3 % EX GEL: 1.0000 "application " | Freq: Four times a day (QID) | CUTANEOUS | 1 refills | Status: DC | PRN

## 2019-10-20 NOTE — Telephone Encounter (Signed)
Copied from [place] [id number]. Topic: General - Inquiry >> Oct 20, 2019  4:49 PM Percell Belt A wrote: Reason for CRM: Pharmacy called in and stated that they need to verify the Diclofenac Sodium 3 % GEL [id number], she stated it normally is only 1 % and what was sent was 3%.  Please advice  Best number - [phone]

## 2019-10-20 NOTE — Telephone Encounter (Signed)
CHANGED TO 3% GEL

## 2019-10-20 NOTE — Telephone Encounter (Signed)
Medication Refill - Medication: diclofenac sodium (VOLTAREN) 1 % GEL  Has the patient contacted their pharmacy? No - States that he was speaking with Tye Maryland at the office who was going to look into getting this changed to 3%, but he never heard back from anyone about it. (Agent: If no, request that the patient contact the pharmacy for the refill.) (Agent: If yes, when and what did the pharmacy advise?)  Preferred Pharmacy (with phone number or street name):  [place], [place] - [place] [phone] (Phone) [phone] (Fax)     Agent: Please be advised that RX refills may take up to 3 business days. We ask that you follow-up with your pharmacy.

## 2019-10-21 NOTE — Telephone Encounter (Signed)
Spoke with Ebony Hail at [facility] to let her know that Dr. Derrel Nip did increase the diclofenac sodium gel to the 3%. Ebony Hail gave a verbal understanding.

## 2019-10-23 ENCOUNTER — Other Ambulatory Visit: Payer: Self-pay | Admitting: Internal Medicine

## 2019-10-26 NOTE — Telephone Encounter (Signed)
Refilled: 01/20/2019 Last OV: 05/27/2019 Next OV: not scheduled Last lab: 06/17/2019

## 2019-10-29 ENCOUNTER — Telehealth: Payer: Self-pay | Admitting: Internal Medicine

## 2019-10-29 NOTE — Telephone Encounter (Signed)
PA started today on Cover MY Meds for Voltaren 1% gel for joint pain  KEY [id number]

## 2019-11-02 ENCOUNTER — Ambulatory Visit: Payer: PRIVATE HEALTH INSURANCE | Admitting: Podiatry

## 2019-11-03 ENCOUNTER — Ambulatory Visit: Payer: PRIVATE HEALTH INSURANCE | Admitting: Podiatry

## 2019-11-03 ENCOUNTER — Encounter: Payer: Self-pay | Admitting: Podiatry

## 2019-11-03 ENCOUNTER — Ambulatory Visit: Payer: PRIVATE HEALTH INSURANCE

## 2019-11-03 ENCOUNTER — Other Ambulatory Visit: Payer: Self-pay

## 2019-11-03 DIAGNOSIS — M722 Plantar fascial fibromatosis: Secondary | ICD-10-CM

## 2019-11-03 MED ORDER — DICLOFENAC SODIUM 75 MG PO TBEC: 75.0000 mg | DELAYED_RELEASE_TABLET | Freq: Two times a day (BID) | ORAL | 1 refills | Status: DC

## 2019-11-04 NOTE — Telephone Encounter (Signed)
PA has bee approved through 10/28/2020.

## 2019-11-06 NOTE — Progress Notes (Signed)
   Subjective: 53 y.o. male presenting today with a chief complaint of left heel pain that began 2-3 months ago. He states the pain feels like he is walking on a stone or a bruise. Walking and standing makes the pain worse. He states the pain is the worst in the morning when he first gets out of bed. He has been using a brace, heat and ice therapy and taking Tylenol and Ibuprofen for treatment. Patient is here for further evaluation and treatment.    Past Medical History:  Diagnosis Date  . Nephrolithiasis      Objective: Physical Exam General: The patient is alert and oriented x3 in no acute distress.  Dermatology: Skin is warm, dry and supple bilateral lower extremities. Negative for open lesions or macerations bilateral.   Vascular: Dorsalis Pedis and Posterior Tibial pulses palpable bilateral.  Capillary fill time is immediate to all digits.  Neurological: Epicritic and protective threshold intact bilateral.   Musculoskeletal: Tenderness to palpation to the plantar aspect of the left heel along the plantar fascia. All other joints range of motion within normal limits bilateral. Strength 5/5 in all groups bilateral.   Radiographic exam: Normal osseous mineralization. Joint spaces preserved. No fracture/dislocation/boney destruction. No other soft tissue abnormalities or radiopaque foreign bodies.   Assessment: 1. Plantar fasciitis left foot  Plan of Care:  1. Patient evaluated. Xrays reviewed.   2. Injection of 0.5cc Celestone soluspan injected into the left plantar fascia.  3. Rx for Diclofenac provided to patient.  4. Recommended good shoe gear.  5. Return to clinic as needed.    Edrick Kins, DPM [facility]  Dr. Edrick Kins, DPM    [address], [place] [address]                Office [phone]  Fax [phone]

## 2019-11-09 ENCOUNTER — Other Ambulatory Visit: Payer: Self-pay

## 2019-11-09 ENCOUNTER — Ambulatory Visit (INDEPENDENT_AMBULATORY_CARE_PROVIDER_SITE_OTHER): Payer: PRIVATE HEALTH INSURANCE

## 2019-11-09 DIAGNOSIS — E538 Deficiency of other specified B group vitamins: Secondary | ICD-10-CM

## 2019-11-09 MED ORDER — CYANOCOBALAMIN 1000 MCG/ML IJ SOLN
1000.0000 ug | Freq: Once | INTRAMUSCULAR | Status: AC
  Administered 2019-11-09: 16:00:00 1000 ug via INTRAMUSCULAR

## 2019-11-09 NOTE — Progress Notes (Addendum)
Patient presented for B 12 injection to left deltoid, patient voiced no concerns nor showed any signs of distress during injection   I have reviewed the above information and agree with above.   Teresa Tullo, MD 

## 2019-12-10 ENCOUNTER — Other Ambulatory Visit: Payer: Self-pay

## 2019-12-10 ENCOUNTER — Ambulatory Visit (INDEPENDENT_AMBULATORY_CARE_PROVIDER_SITE_OTHER): Payer: PRIVATE HEALTH INSURANCE | Admitting: Lab

## 2019-12-10 DIAGNOSIS — E538 Deficiency of other specified B group vitamins: Secondary | ICD-10-CM

## 2019-12-10 MED ORDER — CYANOCOBALAMIN 1000 MCG/ML IJ SOLN
1000.0000 ug | Freq: Once | INTRAMUSCULAR | Status: AC
  Administered 2019-12-10: 1000 ug via INTRAMUSCULAR

## 2019-12-10 NOTE — Progress Notes (Addendum)
Pt in office today for B-12 injection in L-Deltoid. Pt tolerated well.   I have reviewed the above information and agree with above.   Deborra Medina, MD

## 2019-12-22 ENCOUNTER — Other Ambulatory Visit: Payer: Self-pay | Admitting: Internal Medicine

## 2020-01-13 ENCOUNTER — Ambulatory Visit (INDEPENDENT_AMBULATORY_CARE_PROVIDER_SITE_OTHER): Payer: PRIVATE HEALTH INSURANCE

## 2020-01-13 ENCOUNTER — Other Ambulatory Visit: Payer: Self-pay

## 2020-01-13 DIAGNOSIS — E538 Deficiency of other specified B group vitamins: Secondary | ICD-10-CM | POA: Diagnosis not present

## 2020-01-13 MED ORDER — CYANOCOBALAMIN 1000 MCG/ML IJ SOLN
1000.0000 ug | Freq: Once | INTRAMUSCULAR | Status: AC
  Administered 2020-01-13: 16:00:00 1000 ug via INTRAMUSCULAR

## 2020-01-13 NOTE — Progress Notes (Addendum)
Patient presented for B 12 injection to left deltoid, patient voiced no concerns nor showed any signs of distress during injection   I have reviewed the above information and agree with above.   Teresa Tullo, MD 

## 2020-01-22 ENCOUNTER — Other Ambulatory Visit: Payer: Self-pay | Admitting: Internal Medicine

## 2020-01-22 NOTE — Telephone Encounter (Signed)
Refill request for Vitamin d, last seen 05-28-19, last filled 10-26-19.  Please advise. Last Vitamin D lab was 05-28-19

## 2020-01-23 ENCOUNTER — Other Ambulatory Visit: Payer: Self-pay | Admitting: Internal Medicine

## 2020-01-23 NOTE — Telephone Encounter (Signed)
Please notify patient that the prescription  was Refilled for 30 days only because it has been 9 months since last visit. Marland Kitchen  OFFICE VISIT NEEDED prior to any more refills

## 2020-01-29 NOTE — Telephone Encounter (Signed)
Spoke with pt and he has been scheduled for a follow up appt. Pt is aware of appt date and time.

## 2020-02-10 ENCOUNTER — Encounter: Payer: Self-pay | Admitting: Internal Medicine

## 2020-02-10 ENCOUNTER — Telehealth (INDEPENDENT_AMBULATORY_CARE_PROVIDER_SITE_OTHER): Payer: PRIVATE HEALTH INSURANCE | Admitting: Internal Medicine

## 2020-02-10 VITALS — Ht 75.0 in | Wt 270.0 lb

## 2020-02-10 DIAGNOSIS — G629 Polyneuropathy, unspecified: Secondary | ICD-10-CM

## 2020-02-10 DIAGNOSIS — F5101 Primary insomnia: Secondary | ICD-10-CM

## 2020-02-10 DIAGNOSIS — E559 Vitamin D deficiency, unspecified: Secondary | ICD-10-CM

## 2020-02-10 DIAGNOSIS — K219 Gastro-esophageal reflux disease without esophagitis: Secondary | ICD-10-CM

## 2020-02-10 DIAGNOSIS — E538 Deficiency of other specified B group vitamins: Secondary | ICD-10-CM

## 2020-02-10 DIAGNOSIS — E781 Pure hyperglyceridemia: Secondary | ICD-10-CM

## 2020-02-10 DIAGNOSIS — Z1211 Encounter for screening for malignant neoplasm of colon: Secondary | ICD-10-CM

## 2020-02-10 DIAGNOSIS — R7303 Prediabetes: Secondary | ICD-10-CM | POA: Diagnosis not present

## 2020-02-10 MED ORDER — GABAPENTIN 100 MG PO CAPS: 100.0000 mg | ORAL_CAPSULE | Freq: Every day | ORAL | 3 refills | Status: DC

## 2020-02-10 NOTE — Assessment & Plan Note (Addendum)
Etiology unclear,  May be multifactorial since he has prediabetes and b12 deficiency.. recheck [id number] level,  Folic acid,  Thyroid,  And RPR.  He has no history of well water use or alcohol abuse.  He declines referral for EMG/[place] studies.  Trial of gabapentin 100 mg qhs

## 2020-02-10 NOTE — Assessment & Plan Note (Addendum)
Secondary to lifestyle , obesity and lack of exercise,  Mediterranean diet reviewed,  Exercise regimens discussed. A1c is needed    Lab Results  Component Value Date   HGBA1C 6.1 06/17/2019

## 2020-02-10 NOTE — Assessment & Plan Note (Addendum)
Managed with 1/2 tablet alprazolam.  The risks and benefits of benzodiazepine  use were reviewed with patient today including excessive sedation leading to respiratory depression,  impaired thinking/driving, and addiction.  Patient was advised to avoid concurrent use with alcohol, to use medication only as needed, not to exceed 1 mg and  and not to share with others. Refills given.

## 2020-02-10 NOTE — Progress Notes (Signed)
Virtual Visit via [place]  This visit type was conducted due to national recommendations for restrictions regarding the COVID-19 pandemic (e.g. social distancing).  This format is felt to be most appropriate for this patient at this time.  All issues noted in this document were discussed and addressed.  No physical exam was performed (except for noted visual exam findings with Video Visits).   I connected with@ on 02/10/20 at  4:30 PM EDT by a video enabled telemedicine application  and verified that I am speaking with the correct person using two identifiers. Location patient: home Location provider: work or home office Persons participating in the virtual visit: patient, provider  I discussed the limitations, risks, security and privacy concerns of performing an evaluation and management service by telephone and the availability of in person appointments. I also discussed with the patient that there may be a patient responsible charge related to this service. The patient expressed understanding and agreed to proceed.  Reason for visit: follow up on chronic conditions   HPI:  54 yr old male with prediabetes,  Obesity, hypertension , chronic insomnia last seen in July presents for med refill.   He feels generally well but has been bothered by constant tingling sensation in his feet.  Was treated by podiatry with steroid injection in heel for plantar fasciitis and mentioned symptom to provider.  Circulation checked and adequate.  Patient has b12 deficiency managed with monthly injections,  No history of well water use, no history of alcohol abuse, but has prediabetes   Insomnia: he is using 0.5 mg alprazolam as needed . Refill history reviewed via  Controlled Substance Database.   ROS: See pertinent positives and negatives per HPI.  Past Medical History:  Diagnosis Date  . Nephrolithiasis     Past Surgical History:  Procedure Laterality Date  . JOINT REPLACEMENT     bilateral hips   [facility]    Family History  Problem Relation Age of Onset  . Cancer Mother 67  . Heart disease Father 57    SOCIAL HX:  reports that he has never smoked. He has never used smokeless tobacco. He reports that he does not drink alcohol or use drugs.   Current Outpatient Medications:  .  ALPRAZolam (XANAX) 1 MG tablet, TAKE ONE TABLET BY MOUTH EVERY DAY AS NEEDED FOR ANXIETY, Disp: 30 tablet, Rfl: 0 .  amLODipine (NORVASC) 10 MG tablet, TAKE 1 TABLET BY MOUTH DAILY, Disp: 90 tablet, Rfl: 1 .  diclofenac sodium (VOLTAREN) 1 % GEL, Apply 2 g topically 4 (four) times daily. AS NEEDED for joint pain, Disp: 350 g, Rfl: 3 .  losartan (COZAAR) 25 MG tablet, TAKE 1 TABLET BY MOUTH AT BEDTIME, Disp: 90 tablet, Rfl: 1 .  traMADol (ULTRAM) 50 MG tablet, TAKE 1 TABLET BY MOUTH 3 TIMES DAILY AS NEEDED, Disp: 90 tablet, Rfl: 4 .  Vitamin D, Ergocalciferol, (DRISDOL) 1.25 MG (50000 UNIT) CAPS capsule, TAKE 1 CAPSULE BY MOUTH ONCE WEEKLY, Disp: 12 capsule, Rfl: 1 .  gabapentin (NEURONTIN) 100 MG capsule, Take 1 capsule (100 mg total) by mouth at bedtime. For neuropathy. Increase gradually as needed, Disp: 90 capsule, Rfl: 3  EXAM:  VITALS per patient if applicable:  GENERAL: alert, oriented, appears well and in no acute distress  HEENT: atraumatic, conjunttiva clear, no obvious abnormalities on inspection of external nose and ears  NECK: normal movements of the head and neck  LUNGS: on inspection no signs of respiratory distress, breathing rate appears  normal, no obvious gross SOB, gasping or wheezing  CV: no obvious cyanosis  MS: moves all visible extremities without noticeable abnormality  PSYCH/NEURO: pleasant and cooperative, no obvious depression or anxiety, speech and thought processing grossly intact  ASSESSMENT AND PLAN:  Discussed the following assessment and plan:  Colon cancer screening - Plan: Cologuard  Neuropathy - Plan: RPR, TSH, IBC + Ferritin  B12 deficiency  - Plan: Vitamin B12, RBC Folate  Prediabetes - Plan: Hemoglobin A1c, Comprehensive metabolic panel  Vitamin D deficiency - Plan: VITAMIN D 25 Hydroxy (Vit-D Deficiency, Fractures)  Primary insomnia  Hypertriglyceridemia - Plan: Lipid panel  Gastroesophageal reflux disease without esophagitis  Insomnia Managed with 1/2 tablet alprazolam.  The risks and benefits of benzodiazepine  use were reviewed with patient today including excessive sedation leading to respiratory depression,  impaired thinking/driving, and addiction.  Patient was advised to avoid concurrent use with alcohol, to use medication only as needed, not to exceed 1 mg and  and not to share with others. Refills given.     Neuropathy Etiology unclear,  May be multifactorial since he has prediabetes and b12 deficiency.. recheck [id number] level,  Folic acid,  Thyroid,  And RPR.  He has no history of well water use or alcohol abuse.  He declines referral for EMG/[place] studies.  Trial of gabapentin 100 mg qhs   Prediabetes Secondary to lifestyle , obesity and lack of exercise,  Mediterranean diet reviewed,  Exercise regimens discussed. A1c is needed    Lab Results  Component Value Date   HGBA1C 6.1 06/17/2019     Vitamin D deficiency Recurrent, managed with supplementation   Hypertriglyceridemia Mild,  Managed with diet,  Addressed with advice for regyular participation in aerobic exercise   Lab Results  Component Value Date   CHOL 176 06/17/2019   HDL 40.50 06/17/2019   LDLCALC 101 (H) 06/17/2019   LDLDIRECT 110.0 04/01/2018   TRIG 175.0 (H) 06/17/2019   CHOLHDL 4 06/17/2019     GERD (gastroesophageal reflux disease) Managed with prn  famotidine     I discussed the assessment and treatment plan with the patient. The patient was provided an opportunity to ask questions and all were answered. The patient agreed with the plan and demonstrated an understanding of the instructions.   The patient was advised to call back or  seek an in-person evaluation if the symptoms worsen or if the condition fails to improve as anticipated.  I provided  30 minutes of non-face-to-face time during this encounter reviewing patient's current problems and past surgeries, labs and imaging studies, providing counseling on the above mentioned problems , and coordination  of care .   Bryan Mc, MD

## 2020-02-11 ENCOUNTER — Ambulatory Visit (INDEPENDENT_AMBULATORY_CARE_PROVIDER_SITE_OTHER): Payer: PRIVATE HEALTH INSURANCE

## 2020-02-11 ENCOUNTER — Other Ambulatory Visit: Payer: Self-pay

## 2020-02-11 DIAGNOSIS — E559 Vitamin D deficiency, unspecified: Secondary | ICD-10-CM

## 2020-02-11 DIAGNOSIS — E538 Deficiency of other specified B group vitamins: Secondary | ICD-10-CM | POA: Diagnosis not present

## 2020-02-11 DIAGNOSIS — G629 Polyneuropathy, unspecified: Secondary | ICD-10-CM | POA: Diagnosis not present

## 2020-02-11 DIAGNOSIS — R7303 Prediabetes: Secondary | ICD-10-CM

## 2020-02-11 DIAGNOSIS — E611 Iron deficiency: Secondary | ICD-10-CM

## 2020-02-11 MED ORDER — CYANOCOBALAMIN 1000 MCG/ML IJ SOLN
1000.0000 ug | Freq: Once | INTRAMUSCULAR | Status: AC
  Administered 2020-02-11: 1000 ug via INTRAMUSCULAR

## 2020-02-11 NOTE — Progress Notes (Signed)
Bryan Galloway presents today for injection per MD orders. B12 injection 1000 mg administered IM in right Upper Arm. Administration without incident. Patient tolerated well. Yang Rack,cma

## 2020-02-12 LAB — VITAMIN D 25 HYDROXY (VIT D DEFICIENCY, FRACTURES): VITD: 48.49 ng/mL (ref 30.00–100.00)

## 2020-02-12 LAB — VITAMIN B12: Vitamin B-12: 575 pg/mL (ref 211–911)

## 2020-02-12 LAB — COMPREHENSIVE METABOLIC PANEL
ALT: 22 U/L (ref 0–53)
AST: 15 U/L (ref 0–37)
Albumin: 4.4 g/dL (ref 3.5–5.2)
Alkaline Phosphatase: 58 U/L (ref 39–117)
BUN: 16 mg/dL (ref 6–23)
CO2: 22 mEq/L (ref 19–32)
Calcium: 9.6 mg/dL (ref 8.4–10.5)
Chloride: 105 mEq/L (ref 96–112)
Creatinine, Ser: 1.1 mg/dL (ref 0.40–1.50)
GFR: 69.23 mL/min (ref >60.00)
Glucose, Bld: 119 mg/dL — ABNORMAL HIGH (ref 70–99)
Potassium: 3.9 mEq/L (ref 3.5–5.1)
Sodium: 140 mEq/L (ref 135–145)
Total Bilirubin: 0.5 mg/dL (ref 0.2–1.2)
Total Protein: 6.9 g/dL (ref 6.0–8.3)

## 2020-02-12 LAB — IBC + FERRITIN
Ferritin: 127.3 ng/mL (ref 22.0–322.0)
Iron: 33 ug/dL — ABNORMAL LOW (ref 42–165)
Saturation Ratios: 9.1 % — ABNORMAL LOW (ref 20.0–50.0)
Transferrin: 258 mg/dL (ref 212.0–360.0)

## 2020-02-12 LAB — RPR: RPR Ser Ql: NONREACTIVE

## 2020-02-12 LAB — TSH: TSH: 1.07 u[IU]/mL (ref 0.35–4.50)

## 2020-02-12 LAB — FOLATE RBC: RBC Folate: 705 ng/mL RBC (ref >280)

## 2020-02-12 LAB — HEMOGLOBIN A1C: Hgb A1c MFr Bld: 5.9 % (ref 4.6–6.5)

## 2020-02-12 NOTE — Assessment & Plan Note (Signed)
Recurrent, managed with supplementation

## 2020-02-12 NOTE — Assessment & Plan Note (Signed)
Mild,  Managed with diet,  Addressed with advice for regyular participation in aerobic exercise   Lab Results  Component Value Date   CHOL 176 06/17/2019   HDL 40.50 06/17/2019   LDLCALC 101 (H) 06/17/2019   LDLDIRECT 110.0 04/01/2018   TRIG 175.0 (H) 06/17/2019   CHOLHDL 4 06/17/2019

## 2020-02-12 NOTE — Assessment & Plan Note (Signed)
Managed with prn  famotidine

## 2020-02-13 DIAGNOSIS — E611 Iron deficiency: Secondary | ICD-10-CM | POA: Insufficient documentation

## 2020-02-13 NOTE — Addendum Note (Signed)
Addended by: [name] on: 02/13/2020 02:06 PM   Modules accepted: Orders

## 2020-02-13 NOTE — Assessment & Plan Note (Signed)
Screening tests normal except for iron deficiency

## 2020-02-13 NOTE — Assessment & Plan Note (Signed)
CBC neede das well as GI referral recommended

## 2020-02-16 ENCOUNTER — Other Ambulatory Visit: Payer: Self-pay | Admitting: Internal Medicine

## 2020-02-16 DIAGNOSIS — E611 Iron deficiency: Secondary | ICD-10-CM

## 2020-02-17 ENCOUNTER — Encounter: Payer: Self-pay | Admitting: Gastroenterology

## 2020-02-18 ENCOUNTER — Encounter: Payer: Self-pay | Admitting: Gastroenterology

## 2020-02-18 ENCOUNTER — Ambulatory Visit (INDEPENDENT_AMBULATORY_CARE_PROVIDER_SITE_OTHER): Payer: PRIVATE HEALTH INSURANCE | Admitting: Gastroenterology

## 2020-02-18 ENCOUNTER — Other Ambulatory Visit: Payer: Self-pay

## 2020-02-18 DIAGNOSIS — E611 Iron deficiency: Secondary | ICD-10-CM

## 2020-02-18 DIAGNOSIS — Z1211 Encounter for screening for malignant neoplasm of colon: Secondary | ICD-10-CM

## 2020-02-18 DIAGNOSIS — D509 Iron deficiency anemia, unspecified: Secondary | ICD-10-CM

## 2020-02-18 MED ORDER — NA SULFATE-K SULFATE-MG SULF 17.5-3.13-1.6 GM/177ML PO SOLN: 354.0000 mL | Freq: Once | ORAL | 0 refills | Status: AC

## 2020-02-18 NOTE — Progress Notes (Signed)
Bryan Galloway  [place], [place] [address]  Main: [phone]  Fax: [phone]   Gastroenterology Consultation  Referring Provider:     [name], MD Primary Care Physician:  [name], MD Reason for Consultation:     Iron deficiency anemia        HPI:   Virtual Visit via Video Note  I connected with patient on 02/18/20 at 10:30 AM EDT by video ([email or web address]) and verified that I am speaking with the correct person using two identifiers.   I discussed the limitations, risks, security and privacy concerns of performing an evaluation and management service by video and the availability of in person appointments. I also discussed with the patient that there may be a patient responsible charge related to this service. The patient expressed understanding and agreed to proceed.  Location of the patient: Home Location of provider: Home Participating persons: Patient and provider only (Nursing staff checked in patient via phone but were not physically involved in the video interaction - see their notes)   History of Present Illness: Chief Complaint  Patient presents with  . New Patient (Initial Visit)  . Anemia    Patient denies any symptoms     Bryan Galloway is a 54 y.o. y/o male referred for consultation & management  by Dr. [name], MD.  Pt found to have low iron saturation with low serum iron on labs. Follow up CBC pending. No prior EGD or colonoscopy.   The patient denies abdominal or flank pain, anorexia, nausea or vomiting, dysphagia, change in bowel habits or black or bloody stools or weight loss.   Past Medical History:  Diagnosis Date  . Nephrolithiasis     Past Surgical History:  Procedure Laterality Date  . JOINT REPLACEMENT     bilateral hips  [facility]    Prior to Admission medications   Medication Sig Start Date End Date Taking? Authorizing Provider  ALPRAZolam Duanne Moron) 1 MG tablet TAKE  ONE TABLET BY MOUTH EVERY DAY AS NEEDED FOR ANXIETY 01/23/20  Yes [name], MD  amLODipine (NORVASC) 10 MG tablet TAKE 1 TABLET BY MOUTH DAILY 12/22/19  Yes [name], MD  diclofenac sodium (VOLTAREN) 1 % GEL Apply 2 g topically 4 (four) times daily. AS NEEDED for joint pain 09/01/19  Yes [name], MD  gabapentin (NEURONTIN) 100 MG capsule Take 1 capsule (100 mg total) by mouth at bedtime. For neuropathy. Increase gradually as needed 02/10/20  Yes [name], MD  losartan (COZAAR) 25 MG tablet TAKE 1 TABLET BY MOUTH AT BEDTIME 12/22/19  Yes [name], MD  traMADol (ULTRAM) 50 MG tablet TAKE 1 TABLET BY MOUTH 3 TIMES DAILY AS NEEDED 07/28/19  Yes [name], MD  Vitamin D, Ergocalciferol, (DRISDOL) 1.25 MG (50000 UNIT) CAPS capsule TAKE 1 CAPSULE BY MOUTH ONCE WEEKLY 01/23/20  Yes [name], MD    Family History  Problem Relation Age of Onset  . Cancer Mother 66  . Heart disease Father 20     Social History   Tobacco Use  . Smoking status: Never Smoker  . Smokeless tobacco: Never Used  Substance Use Topics  . Alcohol use: No  . Drug use: No    Allergies as of 02/18/2020  . (No Known Allergies)    Review of Systems:    All systems reviewed and negative except where noted in HPI.  Observations/Objective:  Labs: CBC    Component Value Date/Time   WBC 7.8 06/18/2018 1703   RBC 5.94 (H) 06/18/2018 1703   HGB 17.1 (H) 06/18/2018 1703   HCT 50.0 06/18/2018 1703   PLT 230.0 06/18/2018 1703   MCV 84.2 06/18/2018 1703   MCHC 34.2 06/18/2018 1703   RDW 13.9 06/18/2018 1703   LYMPHSABS 1.3 06/18/2018 1703   MONOABS 0.4 06/18/2018 1703   EOSABS 0.1 06/18/2018 1703   BASOSABS 0.0 06/18/2018 1703   CMP     Component Value Date/Time   NA 140 02/11/2020 1555   K 3.9 02/11/2020 1555   CL 105 02/11/2020 1555   CO2 22 02/11/2020 1555   GLUCOSE 119 (H) 02/11/2020 1555   BUN 16 02/11/2020 1555   CREATININE 1.10 02/11/2020 1555   CREATININE  0.93 03/28/2012 1521   CALCIUM 9.6 02/11/2020 1555   PROT 6.9 02/11/2020 1555   ALBUMIN 4.4 02/11/2020 1555   AST 15 02/11/2020 1555   ALT 22 02/11/2020 1555   ALKPHOS 58 02/11/2020 1555   BILITOT 0.5 02/11/2020 1555    Imaging Studies: No results found.  Assessment and Plan:   Bryan Galloway is a 54 y.o. y/o male has been referred for Iron deficiency anemia  Assessment and Plan: EGD indicated for Iron def anemia. COlonoscopy for screening and IDA  Pt advised to get CBC done that PCP ordered  I have discussed alternative options, risks & benefits,  which include, but are not limited to, bleeding, infection, perforation,respiratory complication & drug reaction.  The patient agrees with this plan & written consent will be obtained.     Follow Up Instructions:   I discussed the assessment and treatment plan with the patient. The patient was provided an opportunity to ask questions and all were answered. The patient agreed with the plan and demonstrated an understanding of the instructions.   The patient was advised to call back or seek an in-person evaluation if the symptoms worsen or if the condition fails to improve as anticipated.  I provided 15 minutes of face-to-face time via video software during this encounter.  Additional time was spent in reviewing patient's chart, placing orders etc.   Virgel Manifold, MD  Speech recognition software was used to dictate the above note.

## 2020-02-24 ENCOUNTER — Other Ambulatory Visit (INDEPENDENT_AMBULATORY_CARE_PROVIDER_SITE_OTHER): Payer: PRIVATE HEALTH INSURANCE

## 2020-02-24 ENCOUNTER — Other Ambulatory Visit: Payer: Self-pay

## 2020-02-24 DIAGNOSIS — E781 Pure hyperglyceridemia: Secondary | ICD-10-CM | POA: Diagnosis not present

## 2020-02-24 DIAGNOSIS — E611 Iron deficiency: Secondary | ICD-10-CM | POA: Diagnosis not present

## 2020-02-24 DIAGNOSIS — I10 Essential (primary) hypertension: Secondary | ICD-10-CM

## 2020-02-24 LAB — CBC WITH DIFFERENTIAL/PLATELET
Basophils Absolute: 0.1 10*3/uL (ref 0.0–0.1)
Basophils Relative: 1 % (ref 0.0–3.0)
Eosinophils Absolute: 0.2 10*3/uL (ref 0.0–0.7)
Eosinophils Relative: 2.2 % (ref 0.0–5.0)
HCT: 42.5 % (ref 39.0–52.0)
Hemoglobin: 14.2 g/dL (ref 13.0–17.0)
Lymphocytes Relative: 29.7 % (ref 12.0–46.0)
Lymphs Abs: 2.1 10*3/uL (ref 0.7–4.0)
MCHC: 33.3 g/dL (ref 30.0–36.0)
MCV: 85.6 fl (ref 78.0–100.0)
Monocytes Absolute: 0.5 10*3/uL (ref 0.1–1.0)
Monocytes Relative: 7.3 % (ref 3.0–12.0)
Neutro Abs: 4.2 10*3/uL (ref 1.4–7.7)
Neutrophils Relative %: 59.8 % (ref 43.0–77.0)
Platelets: 195 10*3/uL (ref 150.0–400.0)
RBC: 4.96 Mil/uL (ref 4.22–5.81)
RDW: 14.7 % (ref 11.5–15.5)
WBC: 7 10*3/uL (ref 4.0–10.5)

## 2020-02-24 LAB — LIPID PANEL
Cholesterol: 168 mg/dL (ref 0–200)
HDL: 37.1 mg/dL — ABNORMAL LOW (ref >39.00)
LDL Cholesterol: 98 mg/dL (ref 0–99)
NonHDL: 130.86
Total CHOL/HDL Ratio: 5
Triglycerides: 165 mg/dL — ABNORMAL HIGH (ref 0.0–149.0)
VLDL: 33 mg/dL (ref 0.0–40.0)

## 2020-02-26 NOTE — Addendum Note (Signed)
Addended by: [name] on: 02/26/2020 01:35 PM   Modules accepted: Orders

## 2020-03-10 ENCOUNTER — Ambulatory Visit (INDEPENDENT_AMBULATORY_CARE_PROVIDER_SITE_OTHER): Payer: PRIVATE HEALTH INSURANCE

## 2020-03-10 ENCOUNTER — Other Ambulatory Visit: Payer: Self-pay

## 2020-03-10 DIAGNOSIS — E538 Deficiency of other specified B group vitamins: Secondary | ICD-10-CM | POA: Diagnosis not present

## 2020-03-10 MED ORDER — CYANOCOBALAMIN 1000 MCG/ML IJ SOLN
1000.0000 ug | Freq: Once | INTRAMUSCULAR | Status: AC
  Administered 2020-03-10: 1000 ug via INTRAMUSCULAR

## 2020-03-10 NOTE — Progress Notes (Signed)
Patient presented for B 12 injection to left deltoid, patient voiced no concerns nor showed any signs of distress during injection. 

## 2020-03-29 ENCOUNTER — Other Ambulatory Visit
Admission: RE | Admit: 2020-03-29 | Discharge: 2020-03-29 | Disposition: A | Discharge status: Home / Self Care | Payer: PRIVATE HEALTH INSURANCE | Source: Ambulatory Visit | Attending: Gastroenterology | Admitting: Gastroenterology

## 2020-03-29 ENCOUNTER — Other Ambulatory Visit: Payer: Self-pay

## 2020-03-29 DIAGNOSIS — Z01812 Encounter for preprocedural laboratory examination: Secondary | ICD-10-CM | POA: Diagnosis present

## 2020-03-29 DIAGNOSIS — Z20822 Contact with and (suspected) exposure to covid-19: Secondary | ICD-10-CM | POA: Diagnosis not present

## 2020-03-29 LAB — SARS CORONAVIRUS 2 (TAT 6-24 HRS): SARS Coronavirus 2: NEGATIVE

## 2020-03-30 ENCOUNTER — Encounter: Payer: Self-pay | Admitting: Gastroenterology

## 2020-03-31 ENCOUNTER — Ambulatory Visit: Payer: PRIVATE HEALTH INSURANCE | Admitting: Anesthesiology

## 2020-03-31 ENCOUNTER — Encounter: Payer: Self-pay | Admitting: Gastroenterology

## 2020-03-31 ENCOUNTER — Encounter
Admission: RE | Disposition: A | Discharge status: Home / Self Care | Payer: Self-pay | Source: Home / Self Care | Attending: Gastroenterology

## 2020-03-31 ENCOUNTER — Telehealth: Payer: Self-pay

## 2020-03-31 ENCOUNTER — Ambulatory Visit
Admission: RE | Admit: 2020-03-31 | Discharge: 2020-03-31 | Disposition: A | Discharge status: Home / Self Care | Payer: PRIVATE HEALTH INSURANCE | Attending: Gastroenterology | Admitting: Gastroenterology

## 2020-03-31 ENCOUNTER — Other Ambulatory Visit: Payer: Self-pay

## 2020-03-31 DIAGNOSIS — I10 Essential (primary) hypertension: Secondary | ICD-10-CM | POA: Diagnosis not present

## 2020-03-31 DIAGNOSIS — F419 Anxiety disorder, unspecified: Secondary | ICD-10-CM | POA: Diagnosis not present

## 2020-03-31 DIAGNOSIS — Z1211 Encounter for screening for malignant neoplasm of colon: Secondary | ICD-10-CM | POA: Insufficient documentation

## 2020-03-31 DIAGNOSIS — Z87891 Personal history of nicotine dependence: Secondary | ICD-10-CM | POA: Diagnosis not present

## 2020-03-31 DIAGNOSIS — D125 Benign neoplasm of sigmoid colon: Secondary | ICD-10-CM | POA: Diagnosis not present

## 2020-03-31 DIAGNOSIS — D126 Benign neoplasm of colon, unspecified: Secondary | ICD-10-CM

## 2020-03-31 DIAGNOSIS — Z79899 Other long term (current) drug therapy: Secondary | ICD-10-CM | POA: Insufficient documentation

## 2020-03-31 DIAGNOSIS — K635 Polyp of colon: Secondary | ICD-10-CM | POA: Diagnosis not present

## 2020-03-31 DIAGNOSIS — D509 Iron deficiency anemia, unspecified: Secondary | ICD-10-CM

## 2020-03-31 DIAGNOSIS — K219 Gastro-esophageal reflux disease without esophagitis: Secondary | ICD-10-CM | POA: Diagnosis not present

## 2020-03-31 DIAGNOSIS — Z791 Long term (current) use of non-steroidal anti-inflammatories (NSAID): Secondary | ICD-10-CM | POA: Diagnosis not present

## 2020-03-31 HISTORY — DX: Essential (primary) hypertension: I10

## 2020-03-31 HISTORY — PX: COLONOSCOPY WITH PROPOFOL: SHX5780

## 2020-03-31 HISTORY — DX: Personal history of urinary calculi: Z87.442

## 2020-03-31 SURGERY — COLONOSCOPY WITH PROPOFOL
Anesthesia: General

## 2020-03-31 MED ORDER — PROPOFOL 500 MG/50ML IV EMUL
INTRAVENOUS | Status: DC | PRN
  Administered 2020-03-31: 150 ug/kg/min via INTRAVENOUS

## 2020-03-31 MED ORDER — PROPOFOL 10 MG/ML IV BOLUS
INTRAVENOUS | Status: DC | PRN
  Administered 2020-03-31: 100 mg via INTRAVENOUS

## 2020-03-31 MED ORDER — MIDAZOLAM HCL 2 MG/2ML IJ SOLN
INTRAMUSCULAR | Status: DC | PRN
  Administered 2020-03-31: 2 mg via INTRAVENOUS

## 2020-03-31 MED ORDER — PROPOFOL 500 MG/50ML IV EMUL
INTRAVENOUS | Status: AC
  Filled 2020-03-31: qty 50

## 2020-03-31 MED ORDER — SODIUM CHLORIDE 0.9 % IV SOLN
INTRAVENOUS | Status: DC
  Administered 2020-03-31: 1000 mL via INTRAVENOUS

## 2020-03-31 MED ORDER — LIDOCAINE HCL (CARDIAC) PF 100 MG/5ML IV SOSY
PREFILLED_SYRINGE | INTRAVENOUS | Status: DC | PRN
  Administered 2020-03-31: 80 mg via INTRAVENOUS

## 2020-03-31 MED ORDER — TANDEM 162-115.2 MG PO CAPS: 1.0000 | ORAL_CAPSULE | Freq: Every day | ORAL | 2 refills | Status: DC

## 2020-03-31 MED ORDER — MIDAZOLAM HCL 2 MG/2ML IJ SOLN
INTRAMUSCULAR | Status: AC
  Filled 2020-03-31: qty 2

## 2020-03-31 NOTE — Addendum Note (Signed)
Addended by: [name] on: 03/31/2020 05:54 PM   Modules accepted: Orders

## 2020-03-31 NOTE — Telephone Encounter (Signed)
Pt called and stated that he is at [facility] in pre-admit to get his colonoscopy and his EGD done today. Pt stated that insurance is not wanting to pay for the EGD but is covering the colonoscopy. Pt called wanting to know what the EGD was being preformed for. Pt was advised that the EGD was ordered because of iron deficiency anemia but I able to tell him exactly what you was looking for. Pt stated that he is going to go ahead with the colonoscopy for today and if the EGD needs to be rescheduled later that would be okay.

## 2020-03-31 NOTE — Op Note (Signed)
[facility] Gastroenterology Patient Name: Bryan Galloway Procedure Date: 03/31/2020 10:41 AM MRN: [id number] Account #: [id number] Date of Birth: 11-25-1966 Admit Type: Outpatient Age: 54 Room: [facility] ENDO ROOM 2 Gender: Male Note Status: Finalized Procedure:             Colonoscopy Indications:           Screening for colorectal malignant neoplasm Providers:             Romulus Hanrahan B. Bonna Gains MD, MD Referring MD:          Deborra Medina, MD (Referring MD) Medicines:             Monitored Anesthesia Care Complications:         No immediate complications. Procedure:             Pre-Anesthesia Assessment:                        - ASA Grade Assessment: II - A patient with mild                         systemic disease.                        - Prior to the procedure, a History and Physical was                         performed, and patient medications, allergies and                         sensitivities were reviewed. The patient's tolerance                         of previous anesthesia was reviewed.                        - The risks and benefits of the procedure and the                         sedation options and risks were discussed with the                         patient. All questions were answered and informed                         consent was obtained.                        - Patient identification and proposed procedure were                         verified prior to the procedure by the physician, the                         nurse, the anesthesiologist, the anesthetist and the                         technician. The procedure was verified in the                         procedure room.  After obtaining informed consent, the colonoscope was                         passed under direct vision. Throughout the procedure,                         the patient's blood pressure, pulse, and oxygen                         saturations were monitored  continuously. The                         Colonoscope was introduced through the anus and                         advanced to the the cecum, identified by appendiceal                         orifice and ileocecal valve. The colonoscopy was                         performed with ease. The patient tolerated the                         procedure well. The quality of the bowel preparation                         was good. Findings:      The perianal and digital rectal examinations were normal.      A 6 mm polyp was found in the sigmoid colon. The polyp was sessile. The       polyp was removed with a cold snare. Resection and retrieval were       complete.      The exam was otherwise without abnormality.      The rectum, sigmoid colon, descending colon, transverse colon, ascending       colon and cecum appeared normal.      The retroflexed view of the distal rectum and anal verge was normal and       showed no anal or rectal abnormalities. Impression:            - One 6 mm polyp in the sigmoid colon, removed with a                         cold snare. Resected and retrieved.                        - The examination was otherwise normal.                        - The rectum, sigmoid colon, descending colon,                         transverse colon, ascending colon and cecum are normal.                        - The distal rectum and anal verge are normal on  retroflexion view. Recommendation:        - Discharge patient to home (with escort).                        - Advance diet as tolerated.                        - Continue present medications.                        - Await pathology results.                        - Repeat colonoscopy in 5 years.                        - The findings and recommendations were discussed with                         the patient.                        - The findings and recommendations were discussed with                         the  patient's family.                        - Return to primary care physician as previously                         scheduled. Procedure Code(s):     --- Professional ---                        [phone], Colonoscopy, flexible; with removal of                         tumor(s), polyp(s), or other lesion(s) by snare                         technique Diagnosis Code(s):     --- Professional ---                        Z12.11, Encounter for screening for malignant neoplasm                         of colon                        K63.5, Polyp of colon CPT copyright 2019 American Medical Association. All rights reserved. The codes documented in this report are preliminary and upon coder review may  be revised to meet current compliance requirements.  Vonda Antigua, MD Margretta Sidle B. Bonna Gains MD, MD 03/31/2020 11:47:06 AM This report has been signed electronically. Number of Addenda: 0 Note Initiated On: 03/31/2020 10:41 AM Scope Withdrawal Time: 0 hours 20 minutes 41 seconds  Total Procedure Duration: 0 hours 23 minutes 12 seconds  Estimated Blood Loss:  Estimated blood loss: none.      [facility]

## 2020-03-31 NOTE — Telephone Encounter (Signed)
Spoke with pt to let him know that he would need to consult with his GI doctor about the EGD. Pt stated that he was sorry for contacting your personal phone this morning but he didn't know what to do because admissions was telling him that the order was placed by you. Pt did speak with his GI doctor about it though. Pt is wondering if he needs to start an iron supplement until it can be figured out why he has iron deficiency anemia?

## 2020-03-31 NOTE — Transfer of Care (Signed)
Immediate Anesthesia Transfer of Care Note  Patient: Bryan Galloway  Procedure(s) Performed: Procedure(s): COLONOSCOPY WITH PROPOFOL (N/A)  Patient Location: PACU and Endoscopy Unit  Anesthesia Type:General  Level of Consciousness: sedated  Airway & Oxygen Therapy: Patient Spontanous Breathing and Patient connected to nasal cannula oxygen  Post-op Assessment: Report given to RN and Post -op Vital signs reviewed and stable  Post vital signs: Reviewed and stable  Last Vitals:  Vitals:   03/31/20 1029 03/31/20 1141  BP: (!) 163/89 107/63  Pulse: (!) 51   Resp: 18 14  Temp: 36.4 C (!) 36 C  SpO2: 123XX123 0000000    Complications: No apparent anesthesia complications

## 2020-03-31 NOTE — Telephone Encounter (Signed)
I did not order the EGD,  The gastroenterologist did,  So he needs to direct his questions to the gastroenterologist who ordered the EGD

## 2020-03-31 NOTE — Telephone Encounter (Signed)
If you can tolerate a daily iron supplement with food or orange juice to prevent  nausea  I would recommend taking one for 6 weeks and repeating your labs at that time .  I will send one to your pharmacy  You may develop constipation with iron supplements;  try using a stool softener (Colace) at night.

## 2020-03-31 NOTE — Anesthesia Procedure Notes (Signed)
Date/Time: 03/31/2020 11:07 AM Performed by: Doreen Salvage, CRNA Pre-anesthesia Checklist: Patient identified, Emergency Drugs available, Suction available and Patient being monitored Patient Re-evaluated:Patient Re-evaluated prior to induction Oxygen Delivery Method: Nasal cannula Induction Type: IV induction Dental Injury: Teeth and Oropharynx as per pre-operative assessment  Comments: Nasal cannula with etCO2 monitoring

## 2020-03-31 NOTE — Anesthesia Preprocedure Evaluation (Signed)
Anesthesia Evaluation  Patient identified by MRN, date of birth, ID band Patient awake    Reviewed: Allergy & Precautions, NPO status , Patient's Chart, lab work & pertinent test results  History of Anesthesia Complications Negative for: history of anesthetic complications  Airway Mallampati: II  TM Distance: >3 FB Neck ROM: Full    Dental no notable dental hx. (+) Teeth Intact   Pulmonary neg pulmonary ROS, neg sleep apnea, neg COPD, Patient abstained from smoking.Not current smoker,    Pulmonary exam normal breath sounds clear to auscultation       Cardiovascular Exercise Tolerance: Good METShypertension, (-) CAD and (-) Past MI (-) dysrhythmias  Rhythm:Regular Rate:Normal - Systolic murmurs    Neuro/Psych PSYCHIATRIC DISORDERS Anxiety negative neurological ROS     GI/Hepatic GERD  Controlled,(+)     (-) substance abuse  ,   Endo/Other  neg diabetes  Renal/GU negative Renal ROS     Musculoskeletal   Abdominal   Peds  Hematology   Anesthesia Other Findings Past Medical History: No date: History of kidney stones No date: Hypertension No date: Nephrolithiasis  Reproductive/Obstetrics                             Anesthesia Physical Anesthesia Plan  ASA: II  Anesthesia Plan: General   Post-op Pain Management:    Induction: Intravenous  PONV Risk Score and Plan: 2 and Ondansetron, Propofol infusion and TIVA  Airway Management Planned: Nasal Cannula  Additional Equipment: None  Intra-op Plan:   Post-operative Plan:   Informed Consent: I have reviewed the patients History and Physical, chart, labs and discussed the procedure including the risks, benefits and alternatives for the proposed anesthesia with the patient or authorized representative who has indicated his/her understanding and acceptance.     Dental advisory given  Plan Discussed with: CRNA and  Surgeon  Anesthesia Plan Comments: (Discussed risks of anesthesia with patient, including possibility of difficulty with spontaneous ventilation under anesthesia necessitating airway intervention, PONV, and rare risks such as cardiac or respiratory or neurological events. Patient understands.)        Anesthesia Quick Evaluation

## 2020-03-31 NOTE — Anesthesia Postprocedure Evaluation (Signed)
Anesthesia Post Note  Patient: Bryan Galloway  Procedure(s) Performed: COLONOSCOPY WITH PROPOFOL (N/A )  Patient location during evaluation: Endoscopy Anesthesia Type: General Level of consciousness: awake and alert Pain management: pain level controlled Vital Signs Assessment: post-procedure vital signs reviewed and stable Respiratory status: spontaneous breathing, nonlabored ventilation, respiratory function stable and patient connected to nasal cannula oxygen Cardiovascular status: blood pressure returned to baseline and stable Postop Assessment: no apparent nausea or vomiting Anesthetic complications: no     Last Vitals:  Vitals:   03/31/20 1201 03/31/20 1211  BP: 116/72 135/89  Pulse:    Resp:    Temp:    SpO2:      Last Pain:  Vitals:   03/31/20 1211  TempSrc:   PainSc: 0-No pain                 Arita Miss

## 2020-03-31 NOTE — H&P (Signed)
Vonda Antigua, MD [address] [place], [place], [place], [address] [address], [place], [place], [place], [address] Phone: [phone]  Fax: [phone]  Primary Care Physician:  [name], MD   Pre-Procedure History & Physical: HPI:  Bryan Galloway is a 54 y.o. male is here for a colonoscopy.   Past Medical History:  Diagnosis Date  . History of kidney stones   . Hypertension   . Nephrolithiasis     Past Surgical History:  Procedure Laterality Date  . JOINT REPLACEMENT     bilateral hips  [facility]    Prior to Admission medications   Medication Sig Start Date End Date Taking? Authorizing Provider  ALPRAZolam Duanne Moron) 1 MG tablet TAKE ONE TABLET BY MOUTH EVERY DAY AS NEEDED FOR ANXIETY 01/23/20   [name], MD  amLODipine (NORVASC) 10 MG tablet TAKE 1 TABLET BY MOUTH DAILY 12/22/19   [name], MD  diclofenac sodium (VOLTAREN) 1 % GEL Apply 2 g topically 4 (four) times daily. AS NEEDED for joint pain 09/01/19   [name], MD  gabapentin (NEURONTIN) 100 MG capsule Take 1 capsule (100 mg total) by mouth at bedtime. For neuropathy. Increase gradually as needed 02/10/20   [name], MD  losartan (COZAAR) 25 MG tablet TAKE 1 TABLET BY MOUTH AT BEDTIME 12/22/19   [name], MD  traMADol (ULTRAM) 50 MG tablet TAKE 1 TABLET BY MOUTH 3 TIMES DAILY AS NEEDED 07/28/19   [name], MD  Vitamin D, Ergocalciferol, (DRISDOL) 1.25 MG (50000 UNIT) CAPS capsule TAKE 1 CAPSULE BY MOUTH ONCE WEEKLY 01/23/20   [name], MD    Allergies as of 02/18/2020  . (No Known Allergies)    Family History  Problem Relation Age of Onset  . Cancer Mother 43  . Heart disease Father 37    Social History   Socioeconomic History  . Marital status: Widowed    Spouse name: Not on file  . Number of children: Not on file  . Years of education: Not on file  . Highest education level: Not on file  Occupational History  . Not on file    Tobacco Use  . Smoking status: Never Smoker  . Smokeless tobacco: Never Used  Substance and Sexual Activity  . Alcohol use: No  . Drug use: No  . Sexual activity: Not Currently    Partners: Female  Other Topics Concern  . Not on file  Social History Narrative  . Not on file   Social Determinants of Health   Financial Resource Strain:   . Difficulty of Paying Living Expenses:   Food Insecurity:   . Worried About Charity fundraiser in the Last Year:   . Arboriculturist in the Last Year:   Transportation Needs:   . Film/video editor (Medical):   Marland Kitchen Lack of Transportation (Non-Medical):   Physical Activity:   . Days of Exercise per Week:   . Minutes of Exercise per Session:   Stress:   . Feeling of Stress :   Social Connections:   . Frequency of Communication with Friends and Family:   . Frequency of Social Gatherings with Friends and Family:   . Attends Religious Services:   . Active Member of Clubs or Organizations:   . Attends Archivist Meetings:   Marland Kitchen Marital Status:   Intimate Partner Violence:   . Fear of Current or Ex-Partner:   .  Emotionally Abused:   Marland Kitchen Physically Abused:   . Sexually Abused:     Review of Systems: See HPI, otherwise negative ROS  Physical Exam: BP (!) 163/89   Pulse (!) 51   Temp 97.6 F (36.4 C) (Tympanic)   Resp 18   Ht 6\' 3"  (1.905 m)   Wt 123.4 kg   SpO2 100%   BMI 34.00 kg/m  General:   Alert,  pleasant and cooperative in NAD Head:  Normocephalic and atraumatic. Neck:  Supple; no masses or thyromegaly. Lungs:  Clear throughout to auscultation, normal respiratory effort.    Heart:  +S1, +S2, Regular rate and rhythm, No edema. Abdomen:  Soft, nontender and nondistended. Normal bowel sounds, without guarding, and without rebound.   Neurologic:  Alert and  oriented x4;  grossly normal neurologically.  Impression/Plan: Bryan Galloway is here for a colonoscopy to be performed for average risk  screening.  Risks, benefits, limitations, and alternatives regarding  colonoscopy have been reviewed with the patient.  Questions have been answered.  All parties agreeable.   Virgel Manifold, MD  03/31/2020, 11:07 AM

## 2020-04-01 ENCOUNTER — Encounter: Payer: Self-pay | Admitting: *Deleted

## 2020-04-01 LAB — SURGICAL PATHOLOGY

## 2020-04-01 NOTE — Telephone Encounter (Signed)
Spoke with Bryan Galloway and informed of the iron supplement that Dr. Derrel Nip sent in and the directions. Also advised Bryan Galloway that if he starts taking the medication and is able to tolerate it he would need to call back and schedule a 6 week lab appt. Bryan Galloway gave a verbal understanding.

## 2020-04-04 ENCOUNTER — Encounter: Payer: Self-pay | Admitting: Gastroenterology

## 2020-04-05 ENCOUNTER — Other Ambulatory Visit: Payer: Self-pay

## 2020-04-06 ENCOUNTER — Telehealth: Payer: Self-pay

## 2020-04-06 NOTE — Telephone Encounter (Signed)
Patient called and left me a voicemail stating that he had an appointment tomorrow 04/07/2020 and he does not want to come in since he already had his colonoscopy done last week and he knows his results.  I called patient back and had to leave him a voicemail letting him know that Dr. Bonna Gains wanted to see him in 6 weeks from his appointment on 02/18/2020 to make sure he was doing well. I told him that we could reschedule it if he wanted. Now waiting for him to call back to let us know what to do.

## 2020-04-07 ENCOUNTER — Ambulatory Visit: Payer: PRIVATE HEALTH INSURANCE | Admitting: Gastroenterology

## 2020-04-07 NOTE — Telephone Encounter (Signed)
Patient called this AM to cancel his follow up appointment. However, he would want his colonoscopy results from 03/31/20. Can you please advise what I am able to tell him. Thank you.

## 2020-04-08 NOTE — Telephone Encounter (Signed)
Called patient to let him know that Dr. Bonna Gains removed a polyp and that it was precancerous, therefore, she recommended for [place] to put him on our recall list to schedule him a colonoscopy in 5 years. Patient was also informed to give [place] a call if he noticed any new rectal bleeding, abdominal pain or significant bowel habit changes. Patient agreed and had no further questions.

## 2020-04-13 ENCOUNTER — Other Ambulatory Visit: Payer: Self-pay

## 2020-04-13 ENCOUNTER — Ambulatory Visit (INDEPENDENT_AMBULATORY_CARE_PROVIDER_SITE_OTHER): Payer: PRIVATE HEALTH INSURANCE

## 2020-04-13 DIAGNOSIS — E538 Deficiency of other specified B group vitamins: Secondary | ICD-10-CM | POA: Diagnosis not present

## 2020-04-13 MED ORDER — CYANOCOBALAMIN 1000 MCG/ML IJ SOLN
1000.0000 ug | Freq: Once | INTRAMUSCULAR | Status: AC
Start: 2020-04-13 — End: 2020-04-13
  Administered 2020-04-13: 1000 ug via INTRAMUSCULAR

## 2020-04-13 NOTE — Progress Notes (Signed)
Patient presented for B 12 injection to right deltoid, patient voiced no concerns nor showed any signs of distress during injection. 

## 2020-05-17 ENCOUNTER — Ambulatory Visit: Payer: PRIVATE HEALTH INSURANCE

## 2020-05-19 ENCOUNTER — Ambulatory Visit (INDEPENDENT_AMBULATORY_CARE_PROVIDER_SITE_OTHER): Payer: PRIVATE HEALTH INSURANCE

## 2020-05-19 ENCOUNTER — Other Ambulatory Visit: Payer: Self-pay

## 2020-05-19 ENCOUNTER — Other Ambulatory Visit (INDEPENDENT_AMBULATORY_CARE_PROVIDER_SITE_OTHER): Payer: PRIVATE HEALTH INSURANCE

## 2020-05-19 DIAGNOSIS — E538 Deficiency of other specified B group vitamins: Secondary | ICD-10-CM

## 2020-05-19 DIAGNOSIS — I10 Essential (primary) hypertension: Secondary | ICD-10-CM

## 2020-05-19 DIAGNOSIS — R944 Abnormal results of kidney function studies: Secondary | ICD-10-CM

## 2020-05-19 MED ORDER — CYANOCOBALAMIN 1000 MCG/ML IJ SOLN
1000.0000 ug | Freq: Once | INTRAMUSCULAR | Status: AC
  Administered 2020-05-19: 1000 ug via INTRAMUSCULAR

## 2020-05-19 NOTE — Progress Notes (Signed)
Patient presented for B 12 injection to left deltoid, patient voiced no concerns nor showed any signs of distress during injection. 

## 2020-05-20 LAB — COMPREHENSIVE METABOLIC PANEL
ALT: 18 U/L (ref 0–53)
AST: 15 U/L (ref 0–37)
Albumin: 4.7 g/dL (ref 3.5–5.2)
Alkaline Phosphatase: 56 U/L (ref 39–117)
BUN: 14 mg/dL (ref 6–23)
CO2: 23 mEq/L (ref 19–32)
Calcium: 9.8 mg/dL (ref 8.4–10.5)
Chloride: 105 mEq/L (ref 96–112)
Creatinine, Ser: 1.3 mg/dL (ref 0.40–1.50)
GFR: 57.03 mL/min — ABNORMAL LOW (ref >60.00)
Glucose, Bld: 137 mg/dL — ABNORMAL HIGH (ref 70–99)
Potassium: 3.7 mEq/L (ref 3.5–5.1)
Sodium: 142 mEq/L (ref 135–145)
Total Bilirubin: 0.4 mg/dL (ref 0.2–1.2)
Total Protein: 6.7 g/dL (ref 6.0–8.3)

## 2020-05-22 NOTE — Addendum Note (Signed)
Addended by: [name] on: 05/22/2020 10:45 PM   Modules accepted: Orders

## 2020-05-24 ENCOUNTER — Other Ambulatory Visit: Payer: Self-pay | Admitting: Internal Medicine

## 2020-06-07 ENCOUNTER — Other Ambulatory Visit (INDEPENDENT_AMBULATORY_CARE_PROVIDER_SITE_OTHER): Payer: PRIVATE HEALTH INSURANCE

## 2020-06-07 ENCOUNTER — Other Ambulatory Visit: Payer: Self-pay

## 2020-06-07 DIAGNOSIS — R944 Abnormal results of kidney function studies: Secondary | ICD-10-CM | POA: Diagnosis not present

## 2020-06-08 LAB — BASIC METABOLIC PANEL
BUN: 14 mg/dL (ref 6–23)
CO2: 24 mEq/L (ref 19–32)
Calcium: 9.3 mg/dL (ref 8.4–10.5)
Chloride: 105 mEq/L (ref 96–112)
Creatinine, Ser: 1.19 mg/dL (ref 0.40–1.50)
GFR: 63.15 mL/min (ref >60.00)
Glucose, Bld: 101 mg/dL — ABNORMAL HIGH (ref 70–99)
Potassium: 3.8 mEq/L (ref 3.5–5.1)
Sodium: 139 mEq/L (ref 135–145)

## 2020-06-10 ENCOUNTER — Other Ambulatory Visit: Payer: Self-pay | Admitting: Internal Medicine

## 2020-06-10 DIAGNOSIS — E611 Iron deficiency: Secondary | ICD-10-CM

## 2020-06-12 NOTE — Progress Notes (Signed)
A few days without the iron supplement will not change his iron level significantly.  But if he would prefer to remain on the iron  until his lab visit,  that is fine.   I apologize for the lack of communication.  In the future I will have him schedule an office visit with me (virtual ok ) to review his labs an then there will be no miscommunications

## 2020-06-21 ENCOUNTER — Other Ambulatory Visit (INDEPENDENT_AMBULATORY_CARE_PROVIDER_SITE_OTHER): Payer: PRIVATE HEALTH INSURANCE

## 2020-06-21 ENCOUNTER — Other Ambulatory Visit: Payer: Self-pay

## 2020-06-21 ENCOUNTER — Ambulatory Visit (INDEPENDENT_AMBULATORY_CARE_PROVIDER_SITE_OTHER): Payer: PRIVATE HEALTH INSURANCE

## 2020-06-21 DIAGNOSIS — E611 Iron deficiency: Secondary | ICD-10-CM

## 2020-06-21 DIAGNOSIS — E538 Deficiency of other specified B group vitamins: Secondary | ICD-10-CM

## 2020-06-21 MED ORDER — CYANOCOBALAMIN 1000 MCG/ML IJ SOLN
1000.0000 ug | Freq: Once | INTRAMUSCULAR | Status: AC
  Administered 2020-06-21: 1000 ug via INTRAMUSCULAR

## 2020-06-21 NOTE — Progress Notes (Signed)
Patient presented for B 12 injection to left deltoid, patient voiced no concerns nor showed any signs of distress during injection. 

## 2020-06-22 ENCOUNTER — Other Ambulatory Visit: Payer: Self-pay | Admitting: Internal Medicine

## 2020-06-23 ENCOUNTER — Telehealth: Payer: Self-pay

## 2020-06-23 DIAGNOSIS — E611 Iron deficiency: Secondary | ICD-10-CM

## 2020-06-23 LAB — IRON,TIBC AND FERRITIN PANEL
Ferritin: 170 ng/mL (ref 38–380)
Iron: 60 ug/dL (ref 50–180)

## 2020-06-23 NOTE — Addendum Note (Signed)
Addended by: Tor Netters I on: 06/23/2020 01:15 PM   Modules accepted: Orders

## 2020-06-23 NOTE — Addendum Note (Signed)
Addended by: Leeanne Rio on: 06/23/2020 03:43 PM   Modules accepted: Orders

## 2020-06-23 NOTE — Telephone Encounter (Signed)
Pt actually came back in to have the lab drawn because once the lab talked with [facility] they could not send the blood.

## 2020-06-23 NOTE — Telephone Encounter (Signed)
So the Ferritin and Iron have been resulted. They are sending the blood to [facility] to run the TIBC. They also informed me that TIBC can only be ran at [place] or [facility] now.

## 2020-06-24 LAB — IRON AND TIBC
Iron Saturation: 17 % (ref 15–55)
Iron: 57 ug/dL (ref 38–169)
Total Iron Binding Capacity: 336 ug/dL (ref 250–450)
UIBC: 279 ug/dL (ref 111–343)

## 2020-07-19 ENCOUNTER — Other Ambulatory Visit: Payer: Self-pay | Admitting: Internal Medicine

## 2020-07-20 ENCOUNTER — Telehealth: Payer: Self-pay | Admitting: Internal Medicine

## 2020-07-20 NOTE — Telephone Encounter (Signed)
Medications were refilled for 30 days,  6 month follow up OV needed in September

## 2020-07-20 NOTE — Telephone Encounter (Signed)
Refill request last seen 02-10-20, last filled 07-28-19 tramadol and 01-23-20 for xanax.  Please advise.

## 2020-07-20 NOTE — Telephone Encounter (Signed)
Called patient and scheduled for 08/12/20 at 3:30pm.

## 2020-07-22 ENCOUNTER — Other Ambulatory Visit: Payer: Self-pay | Admitting: Internal Medicine

## 2020-07-26 ENCOUNTER — Other Ambulatory Visit: Payer: Self-pay

## 2020-07-26 ENCOUNTER — Ambulatory Visit (INDEPENDENT_AMBULATORY_CARE_PROVIDER_SITE_OTHER): Payer: PRIVATE HEALTH INSURANCE

## 2020-07-26 DIAGNOSIS — E538 Deficiency of other specified B group vitamins: Secondary | ICD-10-CM

## 2020-07-26 MED ORDER — CYANOCOBALAMIN 1000 MCG/ML IJ SOLN
1000.0000 ug | Freq: Once | INTRAMUSCULAR | Status: AC
Start: 2020-07-26 — End: 2020-07-26
  Administered 2020-07-26: 1000 ug via INTRAMUSCULAR

## 2020-07-26 NOTE — Progress Notes (Signed)
Patient presented for B 12 injection to left deltoid, patient voiced no concerns nor showed any signs of distress during injection. 

## 2020-08-12 ENCOUNTER — Other Ambulatory Visit: Payer: Self-pay

## 2020-08-12 ENCOUNTER — Encounter: Payer: Self-pay | Admitting: Internal Medicine

## 2020-08-12 ENCOUNTER — Ambulatory Visit: Payer: PRIVATE HEALTH INSURANCE | Admitting: Internal Medicine

## 2020-08-12 VITALS — BP 146/76 | HR 65 | Temp 98.1°F | Resp 16 | Ht 75.0 in | Wt 288.0 lb

## 2020-08-12 DIAGNOSIS — I10 Essential (primary) hypertension: Secondary | ICD-10-CM | POA: Diagnosis not present

## 2020-08-12 DIAGNOSIS — R7303 Prediabetes: Secondary | ICD-10-CM

## 2020-08-12 DIAGNOSIS — G629 Polyneuropathy, unspecified: Secondary | ICD-10-CM

## 2020-08-12 DIAGNOSIS — E781 Pure hyperglyceridemia: Secondary | ICD-10-CM | POA: Diagnosis not present

## 2020-08-12 DIAGNOSIS — F5101 Primary insomnia: Secondary | ICD-10-CM

## 2020-08-12 DIAGNOSIS — Z6837 Body mass index (BMI) 37.0-37.9, adult: Secondary | ICD-10-CM

## 2020-08-12 MED ORDER — ALPRAZOLAM 1 MG PO TABS: 1.0000 mg | ORAL_TABLET | Freq: Two times a day (BID) | ORAL | 5 refills | Status: DC | PRN

## 2020-08-12 MED ORDER — TRAMADOL HCL 50 MG PO TABS: 50.0000 mg | ORAL_TABLET | Freq: Four times a day (QID) | ORAL | 5 refills | Status: DC | PRN

## 2020-08-12 NOTE — Progress Notes (Signed)
Subjective:  Patient ID: Bryan Galloway, male    DOB: 01-03-1966  Age: 54 y.o. MRN: [id number]  CC: The primary encounter diagnosis was Hypertriglyceridemia. Diagnoses of Essential hypertension, Primary insomnia, Class 2 severe obesity due to excess calories with serious comorbidity and body mass index (BMI) of 37.0 to 37.9 in adult [facility]), Neuropathy, and Prediabetes were also pertinent to this visit.  HPI Bryan Galloway presents for  Follow up  This visit occurred during the SARS-CoV-2 public health emergency.  Safety protocols were in place, including screening questions prior to the visit, additional usage of staff PPE, and extensive cleaning of exam room while observing appropriate contact time as indicated for disinfecting solutions.   Obesity:  Weight gain addressed  HTN:  Not checking BP at home.  Some dizziness in the shower with head tilt.  Taking amlodipine and losartan as directed.     neuropathy :,  On gabapentin  At night  and B12 IM injections.  Feels dizzy when standing if he closes eyes and tilts head back. Does not occur when sitting ,  driving car, etc mostly in the showed.  Not exercising.    Outpatient Medications Prior to Visit  Medication Sig Dispense Refill  . amLODipine (NORVASC) 10 MG tablet TAKE 1 TABLET BY MOUTH DAILY 90 tablet 1  . cyclobenzaprine (FLEXERIL) 10 MG tablet Take 10 mg by mouth 3 (three) times daily as needed.    . diclofenac sodium (VOLTAREN) 1 % GEL Apply 2 g topically 4 (four) times daily. AS NEEDED for joint pain 350 g 3  . FeFum-FePo-FA-B Cmp-C-Zn-Mn-Cu (SE-TAN PLUS) 162-115.2-1 MG CAPS TAKE 1 CAPSULE BY MOUTH DAILY WITH BREAKFAST 30 capsule 5  . gabapentin (NEURONTIN) 100 MG capsule TAKE ONE CAPSULE AT BEDTIME FOR NEUROPATHY. INCREASE GRADUALLY AS NEEDED 90 capsule 3  . losartan (COZAAR) 25 MG tablet TAKE 1 TABLET BY MOUTH AT BEDTIME 90 tablet 1  . Vitamin D, Ergocalciferol, (DRISDOL) 1.25 MG (50000 UNIT) CAPS capsule TAKE 1 CAPSULE  BY MOUTH ONCE WEEKLY 12 capsule 1  . ALPRAZolam (XANAX) 1 MG tablet TAKE ONE TABLET BY MOUTH TWICE DAILY AS NEEDED FOR ANXIETY 30 tablet 0  . traMADol (ULTRAM) 50 MG tablet TAKE ONE TABLET BY MOUTH EVERY 6 HOURS AS NEEDED 90 tablet 0  . ferrous fumarate-iron polysaccharide complex (TANDEM) 162-115.2 MG CAPS capsule Take 1 capsule by mouth daily with breakfast. (Patient not taking: Reported on 08/12/2020) 30 capsule 2   No facility-administered medications prior to visit.    Review of Systems;  Patient denies headache, fevers, malaise, unintentional weight loss, skin rash, eye pain, sinus congestion and sinus pain, sore throat, dysphagia,  hemoptysis , cough, dyspnea, wheezing, chest pain, palpitations, orthopnea, edema, abdominal pain, nausea, melena, diarrhea, constipation, flank pain, dysuria, hematuria, urinary  Frequency, nocturia, numbness, tingling, seizures,  Focal weakness, Loss of consciousness,  Tremor, insomnia, depression, anxiety, and suicidal ideation.      Objective:  BP (!) 146/76 (BP Location: Left Arm, Patient Position: Sitting, Cuff Size: Large)   Pulse 65   Temp 98.1 F (36.7 C) (Oral)   Resp 16   Ht 6\' 3"  (1.905 m)   Wt 288 lb (130.6 kg)   SpO2 99%   BMI 36.00 kg/m   BP Readings from Last 3 Encounters:  08/12/20 (!) 146/76  03/31/20 135/89  10/07/18 134/78    Wt Readings from Last 3 Encounters:  08/12/20 288 lb (130.6 kg)  03/31/20 272 lb (123.4 kg)  02/10/20 270 lb (  122.5 kg)    General appearance: alert, cooperative and appears stated age Ears: normal TM's and external ear canals both ears Throat: lips, mucosa, and tongue normal; teeth and gums normal Neck: no adenopathy, no carotid bruit, supple, symmetrical, trachea midline and thyroid not enlarged, symmetric, no tenderness/mass/nodules Back: symmetric, no curvature. ROM normal. No CVA tenderness. Lungs: clear to auscultation bilaterally Heart: regular rate and rhythm, S1, S2 normal, no murmur,  click, rub or gallop Abdomen: soft, non-tender; bowel sounds normal; no masses,  no organomegaly Pulses: 2+ and symmetric Skin: Skin color, texture, turgor normal. No rashes or lesions Lymph nodes: Cervical, supraclavicular, and axillary nodes normal.  Lab Results  Component Value Date   HGBA1C 5.9 02/11/2020   HGBA1C 6.1 06/17/2019   HGBA1C 6.0 04/01/2018    Lab Results  Component Value Date   CREATININE 1.19 06/07/2020   CREATININE 1.30 05/19/2020   CREATININE 1.10 02/11/2020    Lab Results  Component Value Date   WBC 7.0 02/24/2020   HGB 14.2 02/24/2020   HCT 42.5 02/24/2020   PLT 195.0 02/24/2020   GLUCOSE 101 (H) 06/07/2020   CHOL 168 02/24/2020   TRIG 165.0 (H) 02/24/2020   HDL 37.10 (L) 02/24/2020   LDLDIRECT 110.0 04/01/2018   LDLCALC 98 02/24/2020   ALT 18 05/19/2020   AST 15 05/19/2020   NA 139 06/07/2020   K 3.8 06/07/2020   CL 105 06/07/2020   CREATININE 1.19 06/07/2020   BUN 14 06/07/2020   CO2 24 06/07/2020   TSH 1.07 02/11/2020   PSA 0.40 06/17/2019   HGBA1C 5.9 02/11/2020   MICROALBUR 2.0 (H) 02/11/2017    No results found.  Assessment & Plan:   Problem List Items Addressed This Visit      Unprioritized   Insomnia    Managed with 1/2 tablet alprazolam.  The risks and benefits of benzodiazepine  use were reviewed with patient today including excessive sedation leading to respiratory depression,  impaired thinking/driving, and addiction.  Patient was advised to avoid concurrent use with alcohol, to use medication only as needed, not to exceed 1 mg and  and not to share with others. Refills given.         Essential hypertension    he reports compliance with medication regimen  (amlodipine 10 mg and losartan 25 mg ) but has an elevated reading today in office.  he is not using NSAIDs daily.  Discussed goal of 120/70  (130/80 for patients over 70)  to preserve renal function.  he has been asked to check her  BP  at home and  submit readings for  evaluation. Renal function, electrolytes and screen for proteinuria are all normal .  Diuretics C/I given history of renal calculi.. he has some mild venous stasis changes,  So will consider reducing amlodipine and increasing losartan to 100 mg daily       Relevant Orders   Microalbumin / creatinine urine ratio   Hypertriglyceridemia - Primary   Relevant Orders   Lipid panel   Class 2 severe obesity due to excess calories with serious comorbidity and body mass index (BMI) of 37.0 to 37.9 in adult [facility])    Progressive weigh gain addressed.  [employer] diet discussed and need for regular exercise .  rtc 3 moths  With goal `12 lbs lost by then       Prediabetes   Relevant Orders   Hemoglobin A1c   Comprehensive metabolic panel   Neuropathy    Screening tests  normal except for iron deficiency and borderline b12  . Both addressed.  Continue gabapentin         I am having Bryan Galloway maintain his diclofenac sodium, Tandem, cyclobenzaprine, Se-Tan PLUS, losartan, amLODipine, gabapentin, Vitamin D (Ergocalciferol), ALPRAZolam, and traMADol.  Meds ordered this encounter  Medications  . DISCONTD: ALPRAZolam (XANAX) 1 MG tablet    Sig: Take 1 tablet (1 mg total) by mouth 2 (two) times daily as needed. for anxiety    Dispense:  30 tablet    Refill:  5  . DISCONTD: traMADol (ULTRAM) 50 MG tablet    Sig: Take 1 tablet (50 mg total) by mouth every 6 (six) hours as needed.    Dispense:  90 tablet    Refill:  5  . ALPRAZolam (XANAX) 1 MG tablet    Sig: Take 1 tablet (1 mg total) by mouth 2 (two) times daily as needed. for anxiety    Dispense:  30 tablet    Refill:  5  . traMADol (ULTRAM) 50 MG tablet    Sig: Take 1 tablet (50 mg total) by mouth every 6 (six) hours as needed.    Dispense:  90 tablet    Refill:  5    Medications Discontinued During This Encounter  Medication Reason  . ALPRAZolam (XANAX) 1 MG tablet Reorder  . traMADol (ULTRAM) 50 MG tablet Reorder  . ALPRAZolam  (XANAX) 1 MG tablet Reorder  . traMADol (ULTRAM) 50 MG tablet Reorder    Follow-up: No follow-ups on file.   [name], MD

## 2020-08-12 NOTE — Assessment & Plan Note (Signed)
Progressive weigh gain addressed.  Optavia diet discussed and need for regular exercise .  rtc 3 moths  With goal `12 lbs lost by then

## 2020-08-12 NOTE — Assessment & Plan Note (Signed)
Managed with 1/2 tablet alprazolam.  The risks and benefits of benzodiazepine  use were reviewed with patient today including excessive sedation leading to respiratory depression,  impaired thinking/driving, and addiction.  Patient was advised to avoid concurrent use with alcohol, to use medication only as needed, not to exceed 1 mg and  and not to share with others. Refills given.

## 2020-08-12 NOTE — Assessment & Plan Note (Signed)
he reports compliance with medication regimen  (amlodipine 10 mg and losartan 25 mg ) but has an elevated reading today in office.  he is not using NSAIDs daily.  Discussed goal of 120/70  (130/80 for patients over 70)  to preserve renal function.  he has been asked to check her  BP  at home and  submit readings for evaluation. Renal function, electrolytes and screen for proteinuria are all normal .  Diuretics C/I given history of renal calculi.. he has some mild venous stasis changes,  So will consider reducing amlodipine and increasing losartan to 100 mg daily

## 2020-08-12 NOTE — Assessment & Plan Note (Signed)
Screening tests normal except for iron deficiency and borderline b12  . Both addressed.  Continue gabapentin

## 2020-08-12 NOTE — Patient Instructions (Addendum)
I want you and Talitha  to consider using  the Optavia Diet  To get some weight off   Goal for you:  250 lbs (eventually)   Core exercises to help your balance  (gluts,  Abs)  Add citrus to your water to prevent kidney stones   Return in 3 months for labs and weigh in  (minimum wt loss goal 12 lbs by then)

## 2020-08-25 ENCOUNTER — Other Ambulatory Visit: Payer: Self-pay

## 2020-08-25 ENCOUNTER — Ambulatory Visit (INDEPENDENT_AMBULATORY_CARE_PROVIDER_SITE_OTHER): Payer: PRIVATE HEALTH INSURANCE

## 2020-08-25 DIAGNOSIS — E538 Deficiency of other specified B group vitamins: Secondary | ICD-10-CM | POA: Diagnosis not present

## 2020-08-25 MED ORDER — CYANOCOBALAMIN 1000 MCG/ML IJ SOLN
1000.0000 ug | Freq: Once | INTRAMUSCULAR | Status: AC
  Administered 2020-08-25: 1000 ug via INTRAMUSCULAR

## 2020-08-25 NOTE — Progress Notes (Addendum)
Patient presented for B 12 injection to left deltoid, patient voiced no concerns nor showed any signs of distress during injection.  Reviewed.  Dr Scott 

## 2020-09-27 ENCOUNTER — Ambulatory Visit (INDEPENDENT_AMBULATORY_CARE_PROVIDER_SITE_OTHER): Payer: PRIVATE HEALTH INSURANCE

## 2020-09-27 ENCOUNTER — Other Ambulatory Visit: Payer: Self-pay

## 2020-09-27 DIAGNOSIS — E538 Deficiency of other specified B group vitamins: Secondary | ICD-10-CM

## 2020-09-27 MED ORDER — CYANOCOBALAMIN 1000 MCG/ML IJ SOLN
1000.0000 ug | Freq: Once | INTRAMUSCULAR | Status: AC
  Administered 2020-09-27: 1000 ug via INTRAMUSCULAR

## 2020-09-27 NOTE — Progress Notes (Signed)
Patient presented for B 12 injection to right deltoid, patient voiced no concerns nor showed any signs of distress during injection. 

## 2020-11-01 ENCOUNTER — Other Ambulatory Visit: Payer: Self-pay

## 2020-11-01 ENCOUNTER — Ambulatory Visit (INDEPENDENT_AMBULATORY_CARE_PROVIDER_SITE_OTHER): Payer: PRIVATE HEALTH INSURANCE

## 2020-11-01 DIAGNOSIS — E538 Deficiency of other specified B group vitamins: Secondary | ICD-10-CM | POA: Diagnosis not present

## 2020-11-01 MED ORDER — CYANOCOBALAMIN 1000 MCG/ML IJ SOLN
1000.0000 ug | Freq: Once | INTRAMUSCULAR | Status: AC
  Administered 2020-11-01: 1000 ug via INTRAMUSCULAR

## 2020-11-01 NOTE — Progress Notes (Signed)
Patient presented for B 12 injection to left deltoid, patient voiced no concerns nor showed any signs of distress during injection. 

## 2020-12-05 ENCOUNTER — Ambulatory Visit (INDEPENDENT_AMBULATORY_CARE_PROVIDER_SITE_OTHER): Payer: PRIVATE HEALTH INSURANCE

## 2020-12-05 ENCOUNTER — Other Ambulatory Visit: Payer: Self-pay

## 2020-12-05 DIAGNOSIS — E538 Deficiency of other specified B group vitamins: Secondary | ICD-10-CM

## 2020-12-05 MED ORDER — CYANOCOBALAMIN 1000 MCG/ML IJ SOLN
1000.0000 ug | Freq: Once | INTRAMUSCULAR | Status: AC
  Administered 2020-12-05: 1000 ug via INTRAMUSCULAR

## 2020-12-05 NOTE — Progress Notes (Addendum)
Patient presented for B 12 injection to left deltoid, patient voiced no concerns nor showed any signs of distress during injection. 

## 2020-12-15 ENCOUNTER — Other Ambulatory Visit: Payer: Self-pay | Admitting: Internal Medicine

## 2021-01-09 ENCOUNTER — Ambulatory Visit (INDEPENDENT_AMBULATORY_CARE_PROVIDER_SITE_OTHER): Payer: PRIVATE HEALTH INSURANCE

## 2021-01-09 ENCOUNTER — Other Ambulatory Visit: Payer: Self-pay

## 2021-01-09 DIAGNOSIS — E538 Deficiency of other specified B group vitamins: Secondary | ICD-10-CM | POA: Diagnosis not present

## 2021-01-09 MED ORDER — CYANOCOBALAMIN 1000 MCG/ML IJ SOLN
1000.0000 ug | Freq: Once | INTRAMUSCULAR | Status: AC
  Administered 2021-01-09: 1000 ug via INTRAMUSCULAR

## 2021-01-09 NOTE — Progress Notes (Signed)
Patient presented for B 12 injection to left deltoid, patient voiced no concerns nor showed any signs of distress during injection. 

## 2021-02-07 ENCOUNTER — Other Ambulatory Visit: Payer: Self-pay

## 2021-02-07 ENCOUNTER — Ambulatory Visit (INDEPENDENT_AMBULATORY_CARE_PROVIDER_SITE_OTHER): Payer: PRIVATE HEALTH INSURANCE | Admitting: *Deleted

## 2021-02-07 DIAGNOSIS — E538 Deficiency of other specified B group vitamins: Secondary | ICD-10-CM

## 2021-02-07 MED ORDER — CYANOCOBALAMIN 1000 MCG/ML IJ SOLN
1000.0000 ug | Freq: Once | INTRAMUSCULAR | Status: AC
  Administered 2021-02-07: 1000 ug via INTRAMUSCULAR

## 2021-02-07 NOTE — Progress Notes (Signed)
Patient presented for B 12 injection to right deltoid, patient voiced no concerns nor showed any signs of distress during injection. 

## 2021-02-15 ENCOUNTER — Other Ambulatory Visit: Payer: Self-pay | Admitting: Internal Medicine

## 2021-02-16 NOTE — Telephone Encounter (Signed)
RX Refill:xanax Last Seen:08-12-20 Last ordered:08-20-20

## 2021-03-14 ENCOUNTER — Ambulatory Visit (INDEPENDENT_AMBULATORY_CARE_PROVIDER_SITE_OTHER): Payer: PRIVATE HEALTH INSURANCE

## 2021-03-14 ENCOUNTER — Other Ambulatory Visit: Payer: Self-pay

## 2021-03-14 DIAGNOSIS — E538 Deficiency of other specified B group vitamins: Secondary | ICD-10-CM

## 2021-03-14 MED ORDER — CYANOCOBALAMIN 1000 MCG/ML IJ SOLN
1000.0000 ug | Freq: Once | INTRAMUSCULAR | Status: AC
  Administered 2021-03-14: 1000 ug via INTRAMUSCULAR

## 2021-03-14 NOTE — Progress Notes (Signed)
Bryan Galloway presents today for injection per MD orders. B12 injection administered IM in left Upper Arm. Administration without incident. Patient tolerated well.  Aryanna Shaver,cma

## 2021-03-17 ENCOUNTER — Ambulatory Visit: Payer: PRIVATE HEALTH INSURANCE | Admitting: Podiatry

## 2021-03-17 ENCOUNTER — Other Ambulatory Visit: Payer: Self-pay

## 2021-03-17 ENCOUNTER — Encounter: Payer: Self-pay | Admitting: Podiatry

## 2021-03-17 DIAGNOSIS — M722 Plantar fascial fibromatosis: Secondary | ICD-10-CM | POA: Diagnosis not present

## 2021-03-22 ENCOUNTER — Telehealth: Payer: Self-pay

## 2021-03-22 NOTE — Telephone Encounter (Signed)
Pt called to request voltaren capsule sent to [facility].

## 2021-03-23 ENCOUNTER — Other Ambulatory Visit: Payer: Self-pay

## 2021-03-23 DIAGNOSIS — M722 Plantar fascial fibromatosis: Secondary | ICD-10-CM

## 2021-03-23 MED ORDER — DICLOFENAC SODIUM 1 % EX GEL: 2.0000 g | Freq: Four times a day (QID) | CUTANEOUS | Status: DC

## 2021-03-24 NOTE — Telephone Encounter (Signed)
Patient calling to request the pill form of Voltaren. Patient was seen 4/22 by Dr. Amalia Hailey, called Wednesday 4/27 for medication and has been waiting for a call back stating medication was ready. Pharmacy told patient there was no medication there for him. Please send to [facility]

## 2021-04-02 MED ORDER — DICLOFENAC SODIUM 75 MG PO TBEC: 75.0000 mg | DELAYED_RELEASE_TABLET | Freq: Two times a day (BID) | ORAL | 1 refills | Status: DC

## 2021-04-02 MED ORDER — BETAMETHASONE SOD PHOS & ACET 6 (3-3) MG/ML IJ SUSP: 3.0000 mg | Freq: Once | INTRAMUSCULAR | Status: AC

## 2021-04-02 NOTE — Progress Notes (Signed)
   Subjective: 55 y.o. male presenting today with a chief complaint of recurrence of left heel pain that began 2-3 months ago. He states the pain feels like he is walking on a stone or a bruise. Walking and standing makes the pain worse. He states the pain is the worst in the morning when he first gets out of bed. He has been using a brace, heat and ice therapy and taking Tylenol and Ibuprofen for treatment. Patient is here for further evaluation and treatment.    Past Medical History:  Diagnosis Date  . History of kidney stones   . Hypertension   . Nephrolithiasis      Objective: Physical Exam General: The patient is alert and oriented x3 in no acute distress.  Dermatology: Skin is warm, dry and supple bilateral lower extremities. Negative for open lesions or macerations bilateral.   Vascular: Dorsalis Pedis and Posterior Tibial pulses palpable bilateral.  Capillary fill time is immediate to all digits.  Neurological: Epicritic and protective threshold intact bilateral.   Musculoskeletal: Tenderness to palpation to the plantar aspect of the left heel along the plantar fascia. All other joints range of motion within normal limits bilateral. Strength 5/5 in all groups bilateral.   Radiographic exam: Normal osseous mineralization. Joint spaces preserved. No fracture/dislocation/boney destruction. No other soft tissue abnormalities or radiopaque foreign bodies.   Assessment: 1. Plantar fasciitis left foot  Plan of Care:  1. Patient evaluated. Xrays reviewed.   2. Injection of 0.5cc Celestone soluspan injected into the left plantar fascia.  3. Rx for Diclofenac provided to patient.  4. Recommended good shoe gear.  5. Return to clinic as needed.    Edrick Kins, DPM [facility]  Dr. Edrick Kins, DPM    [address], [place] [address]                Office [phone]  Fax [phone]

## 2021-04-13 ENCOUNTER — Telehealth: Payer: Self-pay | Admitting: Internal Medicine

## 2021-04-13 ENCOUNTER — Other Ambulatory Visit: Payer: Self-pay

## 2021-04-13 ENCOUNTER — Ambulatory Visit (INDEPENDENT_AMBULATORY_CARE_PROVIDER_SITE_OTHER): Payer: PRIVATE HEALTH INSURANCE

## 2021-04-13 DIAGNOSIS — E538 Deficiency of other specified B group vitamins: Secondary | ICD-10-CM

## 2021-04-13 MED ORDER — CYANOCOBALAMIN 1000 MCG/ML IJ SOLN
1000.0000 ug | Freq: Once | INTRAMUSCULAR | Status: AC
  Administered 2021-04-13: 1000 ug via INTRAMUSCULAR

## 2021-04-13 NOTE — Progress Notes (Signed)
Patient presented for B 12 injection to left deltoid, patient voiced no concerns nor showed any signs of distress during injection. 

## 2021-04-13 NOTE — Telephone Encounter (Signed)
PT needs to have a appointment for his medications for refills. Needs a appointment in June. Currently June is booked out and would prefer to get one on June 20th.

## 2021-04-14 NOTE — Telephone Encounter (Signed)
Patient has been scheduled

## 2021-05-15 ENCOUNTER — Other Ambulatory Visit: Payer: Self-pay

## 2021-05-15 ENCOUNTER — Ambulatory Visit: Payer: PRIVATE HEALTH INSURANCE | Admitting: Internal Medicine

## 2021-05-15 ENCOUNTER — Ambulatory Visit: Payer: PRIVATE HEALTH INSURANCE

## 2021-05-15 VITALS — BP 118/68 | HR 91 | Temp 97.8°F | Ht 75.0 in | Wt 284.0 lb

## 2021-05-15 DIAGNOSIS — M722 Plantar fascial fibromatosis: Secondary | ICD-10-CM

## 2021-05-15 DIAGNOSIS — Z23 Encounter for immunization: Secondary | ICD-10-CM

## 2021-05-15 DIAGNOSIS — F5101 Primary insomnia: Secondary | ICD-10-CM

## 2021-05-15 DIAGNOSIS — I10 Essential (primary) hypertension: Secondary | ICD-10-CM

## 2021-05-15 DIAGNOSIS — E538 Deficiency of other specified B group vitamins: Secondary | ICD-10-CM

## 2021-05-15 DIAGNOSIS — E611 Iron deficiency: Secondary | ICD-10-CM

## 2021-05-15 DIAGNOSIS — D126 Benign neoplasm of colon, unspecified: Secondary | ICD-10-CM

## 2021-05-15 DIAGNOSIS — Z125 Encounter for screening for malignant neoplasm of prostate: Secondary | ICD-10-CM

## 2021-05-15 DIAGNOSIS — Z6837 Body mass index (BMI) 37.0-37.9, adult: Secondary | ICD-10-CM

## 2021-05-15 MED ORDER — CYANOCOBALAMIN 1000 MCG/ML IJ SOLN
1000.0000 ug | Freq: Once | INTRAMUSCULAR | Status: AC
  Administered 2021-05-15: 1000 ug via INTRAMUSCULAR

## 2021-05-15 MED ORDER — DICLOFENAC SODIUM 2 % EX SOLN: 1.0000 "application " | Freq: Four times a day (QID) | CUTANEOUS | 3 refills | Status: DC | PRN

## 2021-05-15 MED ORDER — CYCLOBENZAPRINE HCL 10 MG PO TABS: 10.0000 mg | ORAL_TABLET | Freq: Three times a day (TID) | ORAL | 2 refills | Status: DC | PRN

## 2021-05-15 MED ORDER — DOXYCYCLINE HYCLATE 100 MG PO TABS: 100.0000 mg | ORAL_TABLET | Freq: Two times a day (BID) | ORAL | 0 refills | Status: DC

## 2021-05-15 NOTE — Progress Notes (Signed)
Subjective:  Patient ID: Bryan Galloway, male    DOB: 1966/09/19  Age: 55 y.o. MRN: [id number]  CC: The primary encounter diagnosis was B12 deficiency. Diagnoses of Prostate cancer screening, Class 2 severe obesity due to excess calories with serious comorbidity and body mass index (BMI) of 37.0 to 37.9 in adult [facility]), Essential hypertension, Iron deficiency, Tubular adenoma of colon, Plantar fasciitis of left foot, and Primary insomnia were also pertinent to this visit.  HPI Bryan Galloway presents fo  follow up on hypertension, obesity and insomnia  This visit occurred during the SARS-CoV-2 public health emergency.  Safety protocols were in place, including screening questions prior to the visit, additional usage of staff PPE, and extensive cleaning of exam room while observing appropriate contact time as indicated for disinfecting solutions.    Had a recent episode of back pain and spasm.  Treated with flexeril called in by Anda Latina  Used voltaren orally bid for the last month.  Home bp readings have been mostly high.  Discussed switching to diclofenac gel (Pennsaid)  Obesity:  states that he has been trying,  but unsuccessful,  considering the [place] previously discussed but wife Vladimir Faster not interested. .  Exercise limited to walking currently     Outpatient Medications Prior to Visit  Medication Sig Dispense Refill   ALPRAZolam (XANAX) 1 MG tablet TAKE 1 TABLET BY MOUTH 2 TIMES DAILY AS NEEDED FOR ANXIETY 30 tablet 0   amLODipine (NORVASC) 10 MG tablet TAKE 1 TABLET BY MOUTH DAILY 90 tablet 1   benzonatate (TESSALON) 200 MG capsule Take 200 mg by mouth 3 (three) times daily as needed.     FeFum-FePo-FA-B Cmp-C-Zn-Mn-Cu (SE-TAN PLUS) 162-115.2-1 MG CAPS TAKE 1 CAPSULE BY MOUTH DAILY WITH BREAKFAST 30 capsule 5   ferrous fumarate-iron polysaccharide complex (TANDEM) 162-115.2 MG CAPS capsule Take 1 capsule by mouth daily with breakfast. 30 capsule 2   gabapentin  (NEURONTIN) 100 MG capsule TAKE ONE CAPSULE AT BEDTIME FOR NEUROPATHY. INCREASE GRADUALLY AS NEEDED 90 capsule 3   losartan (COZAAR) 25 MG tablet TAKE 1 TABLET BY MOUTH AT BEDTIME 90 tablet 1   traMADol (ULTRAM) 50 MG tablet Take 1 tablet (50 mg total) by mouth every 6 (six) hours as needed. 90 tablet 5   Vitamin D, Ergocalciferol, (DRISDOL) 1.25 MG (50000 UNIT) CAPS capsule TAKE 1 CAPSULE BY MOUTH ONCE WEEKLY 12 capsule 1   cyclobenzaprine (FLEXERIL) 10 MG tablet Take 10 mg by mouth 3 (three) times daily as needed.     diclofenac (VOLTAREN) 75 MG EC tablet Take 1 tablet (75 mg total) by mouth 2 (two) times daily. 60 tablet 1   diclofenac sodium (VOLTAREN) 1 % GEL Apply 2 g topically 4 (four) times daily. AS NEEDED for joint pain 350 g 3   methylPREDNISolone (MEDROL DOSEPAK) 4 MG TBPK tablet Take by mouth as directed.     azithromycin (ZITHROMAX) 250 MG tablet Take by mouth.     Facility-Administered Medications Prior to Visit  Medication Dose Route Frequency Provider Last Rate Last Admin   betamethasone acetate-betamethasone sodium phosphate (CELESTONE) injection 3 mg  3 mg Intra-articular Once Edrick Kins, DPM       diclofenac Sodium (VOLTAREN) 1 % topical gel 2 g  2 g Topical QID Edrick Kins, DPM        Review of Systems;  Patient denies headache, fevers, malaise, unintentional weight loss, skin rash, eye pain, sinus congestion and sinus pain, sore throat, dysphagia,  hemoptysis , cough, dyspnea, wheezing, chest pain, palpitations, orthopnea, edema, abdominal pain, nausea, melena, diarrhea, constipation, flank pain, dysuria, hematuria, urinary  Frequency, nocturia, numbness, tingling, seizures,  Focal weakness, Loss of consciousness,  Tremor, insomnia, depression, anxiety, and suicidal ideation.      Objective:  BP 118/68   Pulse 91   Temp 97.8 F (36.6 C) (Skin)   Ht 6\' 3"  (1.905 m)   Wt 284 lb (128.8 kg)   SpO2 98%   BMI 35.50 kg/m   BP Readings from Last 3 Encounters:   05/15/21 118/68  08/12/20 (!) 146/76  03/31/20 135/89    Wt Readings from Last 3 Encounters:  05/15/21 284 lb (128.8 kg)  08/12/20 288 lb (130.6 kg)  03/31/20 272 lb (123.4 kg)    General appearance: alert, cooperative and appears stated age Ears: normal TM's and external ear canals both ears Throat: lips, mucosa, and tongue normal; teeth and gums normal Neck: no adenopathy, no carotid bruit, supple, symmetrical, trachea midline and thyroid not enlarged, symmetric, no tenderness/mass/nodules Back: symmetric, no curvature. ROM normal. No CVA tenderness. Lungs: clear to auscultation bilaterally Heart: regular rate and rhythm, S1, S2 normal, no murmur, click, rub or gallop Abdomen: soft, non-tender; bowel sounds normal; no masses,  no organomegaly Pulses: 2+ and symmetric Skin: Skin color, texture, turgor normal. No rashes or lesions Lymph nodes: Cervical, supraclavicular, and axillary nodes normal.  Lab Results  Component Value Date   HGBA1C 5.9 02/11/2020   HGBA1C 6.1 06/17/2019   HGBA1C 6.0 04/01/2018    Lab Results  Component Value Date   CREATININE 1.19 06/07/2020   CREATININE 1.30 05/19/2020   CREATININE 1.10 02/11/2020    Lab Results  Component Value Date   WBC 7.0 02/24/2020   HGB 14.2 02/24/2020   HCT 42.5 02/24/2020   PLT 195.0 02/24/2020   GLUCOSE 101 (H) 06/07/2020   CHOL 168 02/24/2020   TRIG 165.0 (H) 02/24/2020   HDL 37.10 (L) 02/24/2020   LDLDIRECT 110.0 04/01/2018   LDLCALC 98 02/24/2020   ALT 18 05/19/2020   AST 15 05/19/2020   NA 139 06/07/2020   K 3.8 06/07/2020   CL 105 06/07/2020   CREATININE 1.19 06/07/2020   BUN 14 06/07/2020   CO2 24 06/07/2020   TSH 1.07 02/11/2020   PSA 0.40 06/17/2019   HGBA1C 5.9 02/11/2020   MICROALBUR 2.0 (H) 02/11/2017    No results found.  Assessment & Plan:   Problem List Items Addressed This Visit       Unprioritized   B12 deficiency - Primary    Managed with monthly injections since 2018.  Dose  given today          Class 2 severe obesity due to excess calories with serious comorbidity and body mass index (BMI) of 37.0 to 37.9 in adult [facility])    Current strateges to address Progressive weightgain  addressed.  [employer] diet discussed and need for regular exercise .  rtc 6 months  With goal `24 lbs lost by then        Essential hypertension    Home readings have been elevated of late on amlodipine 10 mg and losarta 25 mg daily ; may have been due to use of oral voltaren bid.  RN visit to assess accuracy of home monito planned in one month.  Oral NSAID suspended.        Insomnia    Managed with 1/2 tablet alprazolam.  The risks and benefits of benzodiazepine  use were  reviewed with patient today including excessive sedation leading to respiratory depression,  impaired thinking/driving, and addiction.  Patient was advised to avoid concurrent use with alcohol, to use medication only as needed, not to exceed 1 mg and  and not to share with others. Refills given.          Iron deficiency    Etiology unclear.  Up to date  On colonoscopy (May 2021  One 6 mm TA retrieved). Now taking iron once a week. Repeat labs needed   Lab Results  Component Value Date   IRON 57 06/23/2020   TIBC 336 06/23/2020   FERRITIN 170 06/21/2020   Lab Results  Component Value Date   WBC 7.0 02/24/2020   HGB 14.2 02/24/2020   HCT 42.5 02/24/2020   MCV 85.6 02/24/2020   PLT 195.0 02/24/2020          Plantar fasciitis of left foot    Treated in April with steroid injection by podiatry  and 30 days of oral voltaren.  Given BP elevations at home,  Will suspend oral NSIAD and use PennSaid       Tubular adenoma of colon   Other Visit Diagnoses     Prostate cancer screening       Relevant Orders   PSA       I have discontinued [name]. Masullo's diclofenac sodium, azithromycin, methylPREDNISolone, and diclofenac. I have also changed his cyclobenzaprine. Additionally, I am having him  start on doxycycline and Diclofenac Sodium. Lastly, I am having him maintain his Tandem, Se-Tan PLUS, gabapentin, traMADol, losartan, amLODipine, Vitamin D (Ergocalciferol), ALPRAZolam, and benzonatate. We administered cyanocobalamin. We will continue to administer diclofenac Sodium and betamethasone acetate-betamethasone sodium phosphate.  Meds ordered this encounter  Medications   cyanocobalamin ((VITAMIN B-12)) injection 1,000 mcg   cyclobenzaprine (FLEXERIL) 10 MG tablet    Sig: Take 1 tablet (10 mg total) by mouth 3 (three) times daily as needed.    Dispense:  30 tablet    Refill:  2   doxycycline (VIBRA-TABS) 100 MG tablet    Sig: Take 1 tablet (100 mg total) by mouth 2 (two) times daily.    Dispense:  10 tablet    Refill:  0   Diclofenac Sodium (PENNSAID) 2 % SOLN    Sig: Apply 1 application topically 4 (four) times daily as needed.    Dispense:  112 g    Refill:  3    Medications Discontinued During This Encounter  Medication Reason   azithromycin (ZITHROMAX) 250 MG tablet Patient Preference   cyclobenzaprine (FLEXERIL) 10 MG tablet Reorder   diclofenac sodium (VOLTAREN) 1 % GEL    diclofenac (VOLTAREN) 75 MG EC tablet    methylPREDNISolone (MEDROL DOSEPAK) 4 MG TBPK tablet     Follow-up: No follow-ups on file.   [name], MD

## 2021-05-15 NOTE — Patient Instructions (Signed)
Return in one month for B12,  labs and blood pressure check with home machine to be evaluated   Pennsaid cream sent in place of voltaren gel     [facility] Spotted Fever  can present with fever and a headache, NOT a rash; s the occurrence of these symptoms during spring/summer/fall in the context of recent tick exposure is RMSF until proven otherwise and should be treated immediately with doxycycline.  I have sent this rx to your pharmacy to use if you develop these symptoms.  Let me know if this occurs so we can set you up for the appropriate testing .

## 2021-05-16 DIAGNOSIS — M722 Plantar fascial fibromatosis: Secondary | ICD-10-CM | POA: Insufficient documentation

## 2021-05-16 NOTE — Assessment & Plan Note (Signed)
Managed with 1/2 tablet alprazolam.  The risks and benefits of benzodiazepine  use were reviewed with patient today including excessive sedation leading to respiratory depression,  impaired thinking/driving, and addiction.  Patient was advised to avoid concurrent use with alcohol, to use medication only as needed, not to exceed 1 mg and  and not to share with others. Refills given.

## 2021-05-16 NOTE — Assessment & Plan Note (Signed)
Treated in April with steroid injection by podiatry  and 30 days of oral voltaren.  Given BP elevations at home,  Will suspend oral NSIAD and use PennSaid

## 2021-05-16 NOTE — Assessment & Plan Note (Signed)
Managed with monthly injections since 2018.  Dose given today    

## 2021-05-16 NOTE — Assessment & Plan Note (Addendum)
Home readings have been elevated of late on amlodipine 10 mg and losarta 25 mg daily ; may have been due to use of oral voltaren bid.  RN visit to assess accuracy of home monito planned in one month.  Oral NSAID suspended.

## 2021-05-16 NOTE — Assessment & Plan Note (Signed)
Current strateges to address Progressive weightgain  addressed.  [employer] diet discussed and need for regular exercise .  rtc 6 months  With goal `24 lbs lost by then

## 2021-05-16 NOTE — Assessment & Plan Note (Signed)
Etiology unclear.  Up to date  On colonoscopy (May 2021  One 6 mm TA retrieved). Now taking iron once a week. Repeat labs needed   Lab Results  Component Value Date   IRON 57 06/23/2020   TIBC 336 06/23/2020   FERRITIN 170 06/21/2020   Lab Results  Component Value Date   WBC 7.0 02/24/2020   HGB 14.2 02/24/2020   HCT 42.5 02/24/2020   MCV 85.6 02/24/2020   PLT 195.0 02/24/2020

## 2021-05-17 ENCOUNTER — Other Ambulatory Visit: Payer: Self-pay | Admitting: Internal Medicine

## 2021-05-18 ENCOUNTER — Other Ambulatory Visit: Payer: Self-pay | Admitting: Internal Medicine

## 2021-05-22 ENCOUNTER — Other Ambulatory Visit: Payer: Self-pay | Admitting: Podiatry

## 2021-05-22 NOTE — Telephone Encounter (Signed)
Please advise 

## 2021-06-14 ENCOUNTER — Ambulatory Visit (INDEPENDENT_AMBULATORY_CARE_PROVIDER_SITE_OTHER): Payer: No Typology Code available for payment source | Admitting: *Deleted

## 2021-06-14 ENCOUNTER — Other Ambulatory Visit: Payer: Self-pay

## 2021-06-14 DIAGNOSIS — I10 Essential (primary) hypertension: Secondary | ICD-10-CM | POA: Diagnosis not present

## 2021-06-14 DIAGNOSIS — Z125 Encounter for screening for malignant neoplasm of prostate: Secondary | ICD-10-CM | POA: Diagnosis not present

## 2021-06-14 DIAGNOSIS — E781 Pure hyperglyceridemia: Secondary | ICD-10-CM

## 2021-06-14 DIAGNOSIS — E538 Deficiency of other specified B group vitamins: Secondary | ICD-10-CM | POA: Diagnosis not present

## 2021-06-14 DIAGNOSIS — R7303 Prediabetes: Secondary | ICD-10-CM | POA: Diagnosis not present

## 2021-06-14 MED ORDER — CYANOCOBALAMIN 1000 MCG/ML IJ SOLN
1000.0000 ug | Freq: Once | INTRAMUSCULAR | Status: AC
  Administered 2021-06-14: 1000 ug via INTRAMUSCULAR

## 2021-06-14 NOTE — Progress Notes (Signed)
Patient presented for B 12 injection to right deltoid, patient voiced no concerns nor showed any signs of distress during injection. 

## 2021-06-15 ENCOUNTER — Telehealth: Payer: Self-pay

## 2021-06-15 ENCOUNTER — Other Ambulatory Visit: Payer: Self-pay | Admitting: Internal Medicine

## 2021-06-15 DIAGNOSIS — I129 Hypertensive chronic kidney disease with stage 1 through stage 4 chronic kidney disease, or unspecified chronic kidney disease: Secondary | ICD-10-CM | POA: Insufficient documentation

## 2021-06-15 LAB — COMPREHENSIVE METABOLIC PANEL
ALT: 18 U/L (ref 0–53)
AST: 15 U/L (ref 0–37)
Albumin: 4.5 g/dL (ref 3.5–5.2)
Alkaline Phosphatase: 52 U/L (ref 39–117)
BUN: 13 mg/dL (ref 6–23)
CO2: 23 mEq/L (ref 19–32)
Calcium: 9.6 mg/dL (ref 8.4–10.5)
Chloride: 106 mEq/L (ref 96–112)
Creatinine, Ser: 1.29 mg/dL (ref 0.40–1.50)
GFR: 61.59 mL/min (ref >60.00)
Glucose, Bld: 98 mg/dL (ref 70–99)
Potassium: 3.9 mEq/L (ref 3.5–5.1)
Sodium: 140 mEq/L (ref 135–145)
Total Bilirubin: 0.6 mg/dL (ref 0.2–1.2)
Total Protein: 6.6 g/dL (ref 6.0–8.3)

## 2021-06-15 LAB — MICROALBUMIN / CREATININE URINE RATIO
Creatinine,U: 278.1 mg/dL
Microalb Creat Ratio: 1.4 mg/g (ref 0.0–30.0)
Microalb, Ur: 3.8 mg/dL — ABNORMAL HIGH (ref 0.0–1.9)

## 2021-06-15 LAB — LIPID PANEL
Cholesterol: 188 mg/dL (ref 0–200)
HDL: 37.9 mg/dL — ABNORMAL LOW (ref >39.00)
LDL Cholesterol: 112 mg/dL — ABNORMAL HIGH (ref 0–99)
NonHDL: 150.05
Total CHOL/HDL Ratio: 5
Triglycerides: 190 mg/dL — ABNORMAL HIGH (ref 0.0–149.0)
VLDL: 38 mg/dL (ref 0.0–40.0)

## 2021-06-15 LAB — HEMOGLOBIN A1C: Hgb A1c MFr Bld: 6.2 % (ref 4.6–6.5)

## 2021-06-15 LAB — PSA: PSA: 0.5 ng/mL (ref 0.10–4.00)

## 2021-06-15 MED ORDER — LOSARTAN POTASSIUM 50 MG PO TABS: 50.0000 mg | ORAL_TABLET | Freq: Every day | ORAL | 0 refills | Status: DC

## 2021-06-15 NOTE — Assessment & Plan Note (Signed)
Increase dose of  losartan to 50 mg daily  Lab Results  Component Value Date   MICROALBUR 3.8 (H) 06/14/2021   MICROALBUR 2.0 (H) 02/11/2017

## 2021-06-15 NOTE — Telephone Encounter (Signed)
error 

## 2021-07-17 ENCOUNTER — Other Ambulatory Visit: Payer: Self-pay | Admitting: Internal Medicine

## 2021-07-18 ENCOUNTER — Ambulatory Visit (INDEPENDENT_AMBULATORY_CARE_PROVIDER_SITE_OTHER): Payer: No Typology Code available for payment source

## 2021-07-18 ENCOUNTER — Other Ambulatory Visit: Payer: Self-pay

## 2021-07-18 DIAGNOSIS — E538 Deficiency of other specified B group vitamins: Secondary | ICD-10-CM

## 2021-07-18 MED ORDER — CYANOCOBALAMIN 1000 MCG/ML IJ SOLN
1000.0000 ug | Freq: Once | INTRAMUSCULAR | Status: AC
  Administered 2021-07-18: 1000 ug via INTRAMUSCULAR

## 2021-07-18 NOTE — Progress Notes (Addendum)
Patient presented for B 12 injection to right deltoid, patient voiced no concerns nor showed any signs of distress during injection. 

## 2021-08-21 ENCOUNTER — Other Ambulatory Visit: Payer: Self-pay

## 2021-08-21 ENCOUNTER — Ambulatory Visit (INDEPENDENT_AMBULATORY_CARE_PROVIDER_SITE_OTHER): Payer: No Typology Code available for payment source

## 2021-08-21 DIAGNOSIS — E538 Deficiency of other specified B group vitamins: Secondary | ICD-10-CM | POA: Diagnosis not present

## 2021-08-21 MED ORDER — CYANOCOBALAMIN 1000 MCG/ML IJ SOLN
1000.0000 ug | Freq: Once | INTRAMUSCULAR | Status: AC
  Administered 2021-08-21: 1000 ug via INTRAMUSCULAR

## 2021-08-21 NOTE — Progress Notes (Signed)
Patient presented for B 12 injection to left deltoid, patient voiced no concerns nor showed any signs of distress during injection. 

## 2021-09-11 ENCOUNTER — Other Ambulatory Visit: Payer: Self-pay | Admitting: Internal Medicine

## 2021-09-15 ENCOUNTER — Encounter: Payer: Self-pay | Admitting: Internal Medicine

## 2021-09-15 ENCOUNTER — Ambulatory Visit: Payer: No Typology Code available for payment source | Admitting: Internal Medicine

## 2021-09-15 ENCOUNTER — Other Ambulatory Visit: Payer: Self-pay

## 2021-09-15 DIAGNOSIS — R7303 Prediabetes: Secondary | ICD-10-CM

## 2021-09-15 DIAGNOSIS — I10 Essential (primary) hypertension: Secondary | ICD-10-CM | POA: Diagnosis not present

## 2021-09-15 DIAGNOSIS — G629 Polyneuropathy, unspecified: Secondary | ICD-10-CM | POA: Diagnosis not present

## 2021-09-15 DIAGNOSIS — Z6837 Body mass index (BMI) 37.0-37.9, adult: Secondary | ICD-10-CM

## 2021-09-15 MED ORDER — TIRZEPATIDE 2.5 MG/0.5ML ~~LOC~~ SOAJ: 2.5000 mg | SUBCUTANEOUS | 2 refills | Status: DC

## 2021-09-15 NOTE — Assessment & Plan Note (Signed)
Using gabapentin 200 mg qhs.  Occasional use of tramadol and 1/2 alprazolam when symptoms are severe enough to disrupt sleep

## 2021-09-15 NOTE — Assessment & Plan Note (Signed)
Well controlled on current regimen. Renal function stable, no changes today. 

## 2021-09-15 NOTE — Progress Notes (Signed)
Subjective:  Patient ID: Bryan Galloway, male    DOB: 11-04-1966  Age: 55 y.o. MRN: [id number]  CC: Diagnoses of Prediabetes, Class 2 severe obesity due to excess calories with serious comorbidity and body mass index (BMI) of 37.0 to 37.9 in adult [facility]), Neuropathy, and Essential hypertension were pertinent to this visit.  HPI Bryan Galloway presents for  Chief Complaint  Patient presents with   Follow-up   Diabetes   Hypertension   1)  neuropathy:  taking gabapentin 200 mg qhs. Bought a TENS unit ,. Lost 12 lbvs then regained   2) HTN:  Hypertension: patient checks blood pressure twice weekly at home.  Readings have been for the most part < 140/80 at rest . Patient is following a reduce salt diet most days and is taking medications as prescribed   3) Prediabetes with obesity:  states that he lost 12 lbs but regained 8 due to mother's recent surgery resulting in altered eating habits.  Hungry all the time.Marland Kitchen  discussed using GLP 1 agonist.risks and benefits reviewed Outpatient Medications Prior to Visit  Medication Sig Dispense Refill   ALPRAZolam (XANAX) 1 MG tablet Take 0.5 tablets (0.5 mg total) by mouth 2 (two) times daily as needed for anxiety. 30 tablet 5   amLODipine (NORVASC) 10 MG tablet TAKE 1 TABLET BY MOUTH DAILY 90 tablet 1   benzonatate (TESSALON) 200 MG capsule Take 200 mg by mouth 3 (three) times daily as needed.     cyclobenzaprine (FLEXERIL) 10 MG tablet Take 1 tablet (10 mg total) by mouth 3 (three) times daily as needed. 30 tablet 2   diclofenac (VOLTAREN) 75 MG EC tablet TAKE 1 TABLET BY MOUTH TWICE DAILY 60 tablet 1   Diclofenac Sodium (PENNSAID) 2 % SOLN Apply 1 application topically 4 (four) times daily as needed. 112 g 3   doxycycline (VIBRA-TABS) 100 MG tablet Take 1 tablet (100 mg total) by mouth 2 (two) times daily. 10 tablet 0   FeFum-FePo-FA-B Cmp-C-Zn-Mn-Cu (SE-TAN PLUS) 162-115.2-1 MG CAPS TAKE 1 CAPSULE BY MOUTH DAILY WITH BREAKFAST 30 capsule 5    ferrous fumarate-iron polysaccharide complex (TANDEM) 162-115.2 MG CAPS capsule Take 1 capsule by mouth daily with breakfast. 30 capsule 2   gabapentin (NEURONTIN) 100 MG capsule TAKE ONE CAPSULE AT BEDTIME FOR NEUROPATHY. INCREASE GRADUALLY AS NEEDED 90 capsule 3   losartan (COZAAR) 50 MG tablet TAKE 1 TABLET BY MOUTH AT BEDTIME. 90 tablet 0   traMADol (ULTRAM) 50 MG tablet TAKE 1 TABLET BY MOUTH EVERY 6 HOURS AS NEEDED 90 tablet 5   Vitamin D, Ergocalciferol, (DRISDOL) 1.25 MG (50000 UNIT) CAPS capsule TAKE 1 CAPSULE BY MOUTH ONCE WEEKLY 12 capsule 1   Facility-Administered Medications Prior to Visit  Medication Dose Route Frequency Provider Last Rate Last Admin   betamethasone acetate-betamethasone sodium phosphate (CELESTONE) injection 3 mg  3 mg Intra-articular Once Daylene Katayama M, DPM       diclofenac Sodium (VOLTAREN) 1 % topical gel 2 g  2 g Topical QID Edrick Kins, DPM        Review of Systems;  Patient denies headache, fevers, malaise, unintentional weight loss, skin rash, eye pain, sinus congestion and sinus pain, sore throat, dysphagia,  hemoptysis , cough, dyspnea, wheezing, chest pain, palpitations, orthopnea, edema, abdominal pain, nausea, melena, diarrhea, constipation, flank pain, dysuria, hematuria, urinary  Frequency, nocturia, numbness, tingling, seizures,  Focal weakness, Loss of consciousness,  Tremor, insomnia, depression, anxiety, and suicidal ideation.  Objective:  BP 128/86   Pulse 89   Temp (!) 96.3 F (35.7 C)   Ht 6\' 3"  (1.905 m)   Wt 280 lb 9.6 oz (127.3 kg)   SpO2 97%   BMI 35.07 kg/m   BP Readings from Last 3 Encounters:  09/15/21 128/86  05/15/21 118/68  08/12/20 (!) 146/76    Wt Readings from Last 3 Encounters:  09/15/21 280 lb 9.6 oz (127.3 kg)  05/15/21 284 lb (128.8 kg)  08/12/20 288 lb (130.6 kg)    General appearance: alert, cooperative and appears stated age Ears: normal TM's and external ear canals both ears Throat: lips,  mucosa, and tongue normal; teeth and gums normal Neck: no adenopathy, no carotid bruit, supple, symmetrical, trachea midline and thyroid not enlarged, symmetric, no tenderness/mass/nodules Back: symmetric, no curvature. ROM normal. No CVA tenderness. Lungs: clear to auscultation bilaterally Heart: regular rate and rhythm, S1, S2 normal, no murmur, click, rub or gallop Abdomen: soft, non-tender; bowel sounds normal; no masses,  no organomegaly Pulses: 2+ and symmetric Skin: Skin color, texture, turgor normal. No rashes or lesions Lymph nodes: Cervical, supraclavicular, and axillary nodes normal.  Lab Results  Component Value Date   HGBA1C 6.2 06/14/2021   HGBA1C 5.9 02/11/2020   HGBA1C 6.1 06/17/2019    Lab Results  Component Value Date   CREATININE 1.29 06/14/2021   CREATININE 1.19 06/07/2020   CREATININE 1.30 05/19/2020    Lab Results  Component Value Date   WBC 7.0 02/24/2020   HGB 14.2 02/24/2020   HCT 42.5 02/24/2020   PLT 195.0 02/24/2020   GLUCOSE 98 06/14/2021   CHOL 188 06/14/2021   TRIG 190.0 (H) 06/14/2021   HDL 37.90 (L) 06/14/2021   LDLDIRECT 110.0 04/01/2018   LDLCALC 112 (H) 06/14/2021   ALT 18 06/14/2021   AST 15 06/14/2021   NA 140 06/14/2021   K 3.9 06/14/2021   CL 106 06/14/2021   CREATININE 1.29 06/14/2021   BUN 13 06/14/2021   CO2 23 06/14/2021   TSH 1.07 02/11/2020   PSA 0.50 06/14/2021   HGBA1C 6.2 06/14/2021   MICROALBUR 3.8 (H) 06/14/2021    No results found.  Assessment & Plan:   Problem List Items Addressed This Visit     Class 2 severe obesity due to excess calories with serious comorbidity and body mass index (BMI) of 37.0 to 37.9 in adult [facility])    he has had difficulty losing weight due to increased appetite and is receptive to a trial of Mounjaro       Relevant Medications   tirzepatide (MOUNJARO) 2.5 MG/0.5ML Pen   Neuropathy    Using gabapentin 200 mg qhs.  Occasional use of tramadol and 1/2 alprazolam when symptoms are  severe enough to disrupt sleep       Essential hypertension    Well controlled on current regimen. Renal function stable, no changes today.      Prediabetes    With obesity and hypertension.    Lab Results  Component Value Date   HGBA1C 6.2 06/14/2021         I spent 30 minutes dedicated to the care of this patient on the date of this encounter to include pre-visit review of her medical history,  most recent imaging studies, Face-to-face time with the patient , and post visit ordering of testing and therapeutics.   Meds ordered this encounter  Medications   tirzepatide (MOUNJARO) 2.5 MG/0.5ML Pen    Sig: Inject 2.5 mg into the  skin once a week.    Dispense:  2 mL    Refill:  2    There are no discontinued medications.  Follow-up: No follow-ups on file.   [name], MD

## 2021-09-15 NOTE — Patient Instructions (Addendum)
Continue to check your BP at home.  Goal is 130/80 or less. Notify me if above that consistently    I recommend a medication to help you lose weight called Mounjaro. You no longer have to go through [facility] to receive the medication   You can read about it and obtain the $25 copay card on their website: [email or web address].[email or web address]  Darcel Bayley is a medication that is taken as a weekly subcutaneous injection. It is not insulin.  It  causes your pancreas to increase its  own insulin secretion  And also slows down the emptying of your stomach,  So it decreases your appetite and helps you lose weight.  The dose for the first 4 weekly doses is 2.5 mg .  You may have mild nausea on the first or second day but this should resolve.  If not  ,  stop the medication.   As long as you are losing weight,  you can continue the dose you are on .  Only increase the dose to  5.0 mg  after 4 weeks if your weight has plateaued.  Let me know when you need a refill and what dose you are taking.

## 2021-09-15 NOTE — Assessment & Plan Note (Signed)
With obesity and hypertension.    Lab Results  Component Value Date   HGBA1C 6.2 06/14/2021

## 2021-09-15 NOTE — Assessment & Plan Note (Addendum)
he has had difficulty losing weight due to increased appetite and is receptive to a trial of [employer]

## 2021-09-21 ENCOUNTER — Ambulatory Visit (INDEPENDENT_AMBULATORY_CARE_PROVIDER_SITE_OTHER): Payer: No Typology Code available for payment source

## 2021-09-21 ENCOUNTER — Other Ambulatory Visit: Payer: Self-pay

## 2021-09-21 DIAGNOSIS — E538 Deficiency of other specified B group vitamins: Secondary | ICD-10-CM | POA: Diagnosis not present

## 2021-09-21 MED ORDER — CYANOCOBALAMIN 1000 MCG/ML IJ SOLN
1000.0000 ug | Freq: Once | INTRAMUSCULAR | Status: AC
  Administered 2021-09-21: 1000 ug via INTRAMUSCULAR

## 2021-09-21 NOTE — Progress Notes (Signed)
Patient presented for B 12 injection to left deltoid, patient voiced no concerns nor showed any signs of distress during injection. 

## 2021-10-16 ENCOUNTER — Other Ambulatory Visit: Payer: Self-pay | Admitting: Internal Medicine

## 2021-10-16 NOTE — Telephone Encounter (Signed)
Not in current medication list.  Last OV: 09/15/2021 Next OV: 12/18/2021

## 2021-10-25 ENCOUNTER — Ambulatory Visit (INDEPENDENT_AMBULATORY_CARE_PROVIDER_SITE_OTHER): Payer: No Typology Code available for payment source

## 2021-10-25 ENCOUNTER — Other Ambulatory Visit: Payer: Self-pay

## 2021-10-25 DIAGNOSIS — E538 Deficiency of other specified B group vitamins: Secondary | ICD-10-CM

## 2021-10-25 MED ORDER — CYANOCOBALAMIN 1000 MCG/ML IJ SOLN
1000.0000 ug | Freq: Once | INTRAMUSCULAR | Status: AC
  Administered 2021-10-25: 1000 ug via INTRAMUSCULAR

## 2021-10-25 NOTE — Progress Notes (Signed)
Bryan Galloway presents today for injection per MD orders. B12 injection administered IM in right Upper Arm. Administration without incident. Patient tolerated well.  Reggie Welge,cma

## 2021-11-16 ENCOUNTER — Other Ambulatory Visit: Payer: Self-pay | Admitting: Internal Medicine

## 2021-11-16 NOTE — Telephone Encounter (Signed)
RX Refill: xanax Last Seen: 09-15-21 Last Ordered: 05-18-21 Next Appt: 12-18-21

## 2021-11-28 ENCOUNTER — Other Ambulatory Visit: Payer: Self-pay

## 2021-11-28 ENCOUNTER — Ambulatory Visit (INDEPENDENT_AMBULATORY_CARE_PROVIDER_SITE_OTHER): Payer: No Typology Code available for payment source | Admitting: *Deleted

## 2021-11-28 DIAGNOSIS — E538 Deficiency of other specified B group vitamins: Secondary | ICD-10-CM

## 2021-11-28 MED ORDER — CYANOCOBALAMIN 1000 MCG/ML IJ SOLN
1000.0000 ug | Freq: Once | INTRAMUSCULAR | Status: AC
Start: 2021-11-28 — End: 2021-11-28
  Administered 2021-11-28: 1000 ug via INTRAMUSCULAR

## 2021-11-28 NOTE — Progress Notes (Signed)
Patient presented for B 12 injection to left deltoid, patient voiced no concerns nor showed any signs of distress during injection. 

## 2021-12-18 ENCOUNTER — Other Ambulatory Visit: Payer: Self-pay

## 2021-12-18 ENCOUNTER — Ambulatory Visit (INDEPENDENT_AMBULATORY_CARE_PROVIDER_SITE_OTHER): Payer: No Typology Code available for payment source

## 2021-12-18 ENCOUNTER — Encounter: Payer: Self-pay | Admitting: Internal Medicine

## 2021-12-18 ENCOUNTER — Ambulatory Visit: Payer: No Typology Code available for payment source | Admitting: Internal Medicine

## 2021-12-18 VITALS — BP 122/78 | HR 51 | Temp 98.5°F | Ht 75.0 in | Wt 271.2 lb

## 2021-12-18 DIAGNOSIS — N200 Calculus of kidney: Secondary | ICD-10-CM

## 2021-12-18 DIAGNOSIS — E538 Deficiency of other specified B group vitamins: Secondary | ICD-10-CM

## 2021-12-18 DIAGNOSIS — I129 Hypertensive chronic kidney disease with stage 1 through stage 4 chronic kidney disease, or unspecified chronic kidney disease: Secondary | ICD-10-CM

## 2021-12-18 DIAGNOSIS — E611 Iron deficiency: Secondary | ICD-10-CM | POA: Diagnosis not present

## 2021-12-18 DIAGNOSIS — F5101 Primary insomnia: Secondary | ICD-10-CM

## 2021-12-18 DIAGNOSIS — Z87442 Personal history of urinary calculi: Secondary | ICD-10-CM

## 2021-12-18 DIAGNOSIS — R7303 Prediabetes: Secondary | ICD-10-CM

## 2021-12-18 DIAGNOSIS — G629 Polyneuropathy, unspecified: Secondary | ICD-10-CM | POA: Diagnosis not present

## 2021-12-18 DIAGNOSIS — Z6837 Body mass index (BMI) 37.0-37.9, adult: Secondary | ICD-10-CM

## 2021-12-18 MED ORDER — TAMSULOSIN HCL 0.4 MG PO CAPS: 0.4000 mg | ORAL_CAPSULE | Freq: Every day | ORAL | 3 refills | Status: DC

## 2021-12-18 MED ORDER — MOUNJARO 2.5 MG/0.5ML ~~LOC~~ SOAJ: SUBCUTANEOUS | 2 refills | Status: DC

## 2021-12-18 MED ORDER — TRAMADOL HCL 50 MG PO TABS: 50.0000 mg | ORAL_TABLET | Freq: Four times a day (QID) | ORAL | 1 refills | Status: DC | PRN

## 2021-12-18 MED ORDER — GABAPENTIN 100 MG PO CAPS: ORAL_CAPSULE | ORAL | 3 refills | Status: DC

## 2021-12-18 NOTE — Progress Notes (Signed)
Subjective:  Patient ID: Bryan Galloway, male    DOB: 05/25/1966  Age: 56 y.o. MRN: [id number]  CC: The primary encounter diagnosis was Iron deficiency. Diagnoses of Class 2 severe obesity due to excess calories with serious comorbidity and body mass index (BMI) of 37.0 to 37.9 in adult Bryan Galloway), Neuropathy, B12 deficiency, Nephrolithiasis, Primary insomnia, History of nephrolithiasis, Nephropathy hypertensive, and Prediabetes were also pertinent to this visit.   This visit occurred during the SARS-CoV-2 public health emergency.  Safety protocols were in place, including screening questions prior to the visit, additional usage of staff PPE, and extensive cleaning of exam room while observing appropriate contact time as indicated for disinfecting solutions.    HPI Bryan Galloway presents for follow up on multiple issues, including obesity, peripheral neuropathy,  B12 deficiency, and insomnia  Chief Complaint  Patient presents with   Follow-up    3 mo/ discuss the shingles vaccine/ medication renewal   1) obesity:  He has lost 9 lbs since October.  He had experienced a  20 lb weight gain since Nov  2019. After becoming widowed in 2018,  he and his  second wife have both gained a considerable amount of weight  and have been unsuccessful at restricting diet without pharmacotherapy. Marland Kitchen Has been using  mounjaro since October, and reports that he is still lowing weigh at the lowest dose. Has not refilled since Jan 1.   Currently  his OOP cost is $25/month .   2) nephrolithiasis;  He passed a HUGE kidney stone at home  2 months ago that he produces today for evaluation  the stone has nearly  the diameter of a pencil eraser head andis approximately 0.8 mm long.  He was treated empirically by Dr Tami Ribas with cipro for a few days . His history of nephrolithiasis is remote:  he has not passed one in over a decade .  He declines urology consultation /evaluation   3) neuropathy:  he  continues to have  mild symptoms managed with  200 mg gabapentin qhs , and tramadol as needed .  Averages less than one daily .  No improvement with B12 supplementation   4) Insomnia:  he is managing his insomnia with ambien and alprazolam.  Refill history confirmed via [place] Controlled Substance databas, accessed by me today..   Outpatient Medications Prior to Visit  Medication Sig Dispense Refill   ALPRAZolam (XANAX) 1 MG tablet TAKE ONE-HALF (1/2) TABLET (0.5 MG) BY MOUTH TWICE DAILY AS NEEDED FOR ANXIETY 30 tablet 5   amLODipine (NORVASC) 10 MG tablet TAKE 1 TABLET BY MOUTH DAILY 90 tablet 1   benzonatate (TESSALON) 200 MG capsule Take 200 mg by mouth 3 (three) times daily as needed.     cyclobenzaprine (FLEXERIL) 10 MG tablet Take 1 tablet (10 mg total) by mouth 3 (three) times daily as needed. 30 tablet 2   diclofenac (VOLTAREN) 75 MG EC tablet TAKE 1 TABLET BY MOUTH TWICE DAILY 60 tablet 1   Diclofenac Sodium (PENNSAID) 2 % SOLN Apply 1 application topically 4 (four) times daily as needed. 112 g 3   doxycycline (VIBRA-TABS) 100 MG tablet Take 1 tablet (100 mg total) by mouth 2 (two) times daily. 10 tablet 0   FeFum-FePo-FA-B Cmp-C-Zn-Mn-Cu (SE-TAN PLUS) 162-115.2-1 MG CAPS TAKE 1 CAPSULE BY MOUTH DAILY WITH BREAKFAST 30 capsule 5   ferrous fumarate-iron polysaccharide complex (TANDEM) 162-115.2 MG CAPS capsule Take 1 capsule by mouth daily with breakfast. 30 capsule 2  losartan (COZAAR) 50 MG tablet TAKE 1 TABLET BY MOUTH AT BEDTIME. 90 tablet 0   Vitamin D, Ergocalciferol, (DRISDOL) 1.25 MG (50000 UNIT) CAPS capsule TAKE 1 CAPSULE BY MOUTH ONCE WEEKLY 12 capsule 1   zolpidem (AMBIEN) 10 MG tablet TAKE 1 TABLET BY MOUTH AT BEDTIME AS NEEDED FOR SLEEP 30 tablet 5   gabapentin (NEURONTIN) 100 MG capsule TAKE ONE CAPSULE AT BEDTIME FOR NEUROPATHY. INCREASE GRADUALLY AS NEEDED 90 capsule 3   MOUNJARO 2.5 MG/0.5ML Pen INJECT 2.5MG  INRO SKIN ONCE A WEEK 2 mL 2   traMADol (ULTRAM) 50 MG tablet TAKE 1 TABLET BY MOUTH  EVERY 6 HOURS AS NEEDED 90 tablet 5   Facility-Administered Medications Prior to Visit  Medication Dose Route Frequency Provider Last Rate Last Admin   betamethasone acetate-betamethasone sodium phosphate (CELESTONE) injection 3 mg  3 mg Intra-articular Once Daylene Katayama M, DPM       diclofenac Sodium (VOLTAREN) 1 % topical gel 2 g  2 g Topical QID Edrick Kins, DPM        Review of Systems;  Patient denies headache, fevers, malaise, unintentional weight loss, skin rash, eye pain, sinus congestion and sinus pain, sore throat, dysphagia,  hemoptysis , cough, dyspnea, wheezing, chest pain, palpitations, orthopnea, edema, abdominal pain, nausea, melena, diarrhea, constipation, flank pain, dysuria, hematuria, urinary  Frequency, nocturia, numbness, tingling, seizures,  Focal weakness, Loss of consciousness,  Tremor, insomnia, depression, anxiety, and suicidal ideation.      Objective:  BP 122/78 (BP Location: Left Arm, Patient Position: Sitting, Cuff Size: Normal)    Pulse (!) 51    Temp 98.5 F (36.9 C) (Oral)    Ht 6\' 3"  (1.905 m)    Wt 271 lb 3.2 oz (123 kg)    SpO2 98%    BMI 33.90 kg/m   BP Readings from Last 3 Encounters:  12/18/21 122/78  09/15/21 128/86  05/15/21 118/68    Wt Readings from Last 3 Encounters:  12/18/21 271 lb 3.2 oz (123 kg)  09/15/21 280 lb 9.6 oz (127.3 kg)  05/15/21 284 lb (128.8 kg)    General appearance: alert, cooperative and appears stated age Ears: normal TM's and external ear canals both ears Throat: lips, mucosa, and tongue normal; teeth and gums normal Neck: no adenopathy, no carotid bruit, supple, symmetrical, trachea midline and thyroid not enlarged, symmetric, no tenderness/mass/nodules Back: symmetric, no curvature. ROM normal. No CVA tenderness. Lungs: clear to auscultation bilaterally Heart: regular rate and rhythm, S1, S2 normal, no murmur, click, rub or gallop Abdomen: soft, non-tender; bowel sounds normal; no masses,  no  organomegaly Pulses: 2+ and symmetric Skin: Skin color, texture, turgor normal. No rashes or lesions Lymph nodes: Cervical, supraclavicular, and axillary nodes normal.  Lab Results  Component Value Date   HGBA1C 6.2 06/14/2021   HGBA1C 5.9 02/11/2020   HGBA1C 6.1 06/17/2019    Lab Results  Component Value Date   CREATININE 1.26 12/18/2021   CREATININE 1.29 06/14/2021   CREATININE 1.19 06/07/2020    Lab Results  Component Value Date   WBC 6.7 12/18/2021   HGB 15.3 12/18/2021   HCT 46.5 12/18/2021   PLT 197.0 12/18/2021   GLUCOSE 92 12/18/2021   CHOL 188 06/14/2021   TRIG 190.0 (H) 06/14/2021   HDL 37.90 (L) 06/14/2021   LDLDIRECT 110.0 04/01/2018   LDLCALC 112 (H) 06/14/2021   ALT 15 12/18/2021   AST 14 12/18/2021   NA 139 12/18/2021   K 4.3 12/18/2021   CL  104 12/18/2021   CREATININE 1.26 12/18/2021   BUN 17 12/18/2021   CO2 28 12/18/2021   TSH 1.07 02/11/2020   PSA 0.50 06/14/2021   HGBA1C 6.2 06/14/2021   MICROALBUR 3.8 (H) 06/14/2021    No results found.  Assessment & Plan:   Problem List Items Addressed This Visit     Insomnia    Managed with Lorrin Mais (prescribed by Dr Tami Ribas)  and  1/2 tablet alprazolam for early wakeups. .  The risks and benefits of benzodiazepine  use were reviewed with patient today including excessive sedation leading to respiratory depression,  impaired thinking/driving, and addiction.  Patient was advised to avoid concurrent use with alcohol, to use medication only as needed, not to exceed 1 mg and  and not to share with others.        Class 2 severe obesity due to excess calories with serious comorbidity and body mass index (BMI) of 37.0 to 37.9 in adult [facility])    He has had a 9 lb lost since October with use of lowest dose Mounjaro. Refills given.      Relevant Medications   tirzepatide [facility]) 2.5 MG/0.5ML Pen   Prediabetes    Complicated by obesity and hypertension.   Continue mounjaro and ARB    Lab Results  Component  Value Date   HGBA1C 6.2 06/14/2021         B12 deficiency   Relevant Orders   RBC Folate   Neuropathy    He continues to report persistent sensation of burning of feet and mild  dizziness with head tilt.  Taking 200 mg gabapentin at bedtime only  Serologic workup has revealed a b12 deficiency which has been addressed  .  Checking RBC folate. Neurology evaluation deferred by patient since sympotms have been mild and non progressive      Relevant Orders   Comprehensive metabolic panel (Completed)   CBC with Differential/Platelet (Completed)   Iron deficiency - Primary    Noted last year  during screening .  Sent to GI;  Colonoscopy without EGD done in 2021.  No longer taking iron  Supplements.  Repeat labs  Are normal.   Lab Results  Component Value Date   IRON 56 12/18/2021   TIBC 366 12/18/2021   FERRITIN 131 12/18/2021   Lab Results  Component Value Date   WBC 6.7 12/18/2021   HGB 15.3 12/18/2021   HCT 46.5 12/18/2021   MCV 85.3 12/18/2021   PLT 197.0 12/18/2021          Relevant Orders   Iron, TIBC and Ferritin Panel (Completed)   Nephropathy hypertensive    Managed with losartan  50 mg daily  Lab Results  Component Value Date   MICROALBUR 3.8 (H) 06/14/2021   MICROALBUR 2.0 (H) 02/11/2017           History of nephrolithiasis    Recent spontaneous passage of a HUMONGOUS stone reviewed.  Stone analaysis deferred by patient.  Checking UA and abd films today, UA notes elevated SG and calcium oxalate crystals, without hematuria or pyuria  Continue aggressive hydration and citric acid supplementation   rx for tamsulosin given for prn use if he has a recurrence       Other Visit Diagnoses     Nephrolithiasis       Relevant Medications   traMADol (ULTRAM) 50 MG tablet   Other Relevant Orders   Urinalysis, Routine w reflex microscopic (Completed)   DG Abd 1 View  Meds ordered this encounter  Medications   tirzepatide (MOUNJARO) 2.5 MG/0.5ML Pen     Sig: INJECT 2.5MG  INtO SKIN ONCE A WEEK    Dispense:  2 mL    Refill:  2   traMADol (ULTRAM) 50 MG tablet    Sig: Take 1 tablet (50 mg total) by mouth every 6 (six) hours as needed.    Dispense:  90 tablet    Refill:  1   gabapentin (NEURONTIN) 100 MG capsule    Sig: TAKE ONE CAPSULE AT BEDTIME FOR NEUROPATHY. INCREASE GRADUALLY AS NEEDED    Dispense:  90 capsule    Refill:  3   tamsulosin (FLOMAX) 0.4 MG CAPS capsule    Sig: Take 1 capsule (0.4 mg total) by mouth daily. To help pass kidney stone    Dispense:  30 capsule    Refill:  3     I provided  40 minutes of  face-to-face time during this encounter reviewing patient's current problems and past surgeries, labs and imaging studies, providing counseling on the above mentioned problems , and coordination  of care .   Follow-up: No follow-ups on file.   [name], MD

## 2021-12-18 NOTE — Patient Instructions (Addendum)
If the mounjaro is no longer covered  by insurance,  let me know and we will try to get [facility] approved   I have refilled the gabapentin, tramadol and added Flomax (tamsulosin) to use the next time you feel you have a stone to pass

## 2021-12-18 NOTE — Assessment & Plan Note (Addendum)
He continues to report persistent sensation of burning of feet and mild  dizziness with head tilt.  Taking 200 mg gabapentin at bedtime only  Serologic workup has revealed a b12 deficiency which has been addressed  .  Checking RBC folate. Neurology evaluation deferred by patient since sympotms have been mild and non progressive

## 2021-12-18 NOTE — Assessment & Plan Note (Addendum)
He has had a 9 lb lost since October with use of lowest dose Mounjaro. Refills given.

## 2021-12-19 DIAGNOSIS — Z87442 Personal history of urinary calculi: Secondary | ICD-10-CM | POA: Insufficient documentation

## 2021-12-19 LAB — COMPREHENSIVE METABOLIC PANEL
ALT: 15 U/L (ref 0–53)
AST: 14 U/L (ref 0–37)
Albumin: 4.7 g/dL (ref 3.5–5.2)
Alkaline Phosphatase: 56 U/L (ref 39–117)
BUN: 17 mg/dL (ref 6–23)
CO2: 28 mEq/L (ref 19–32)
Calcium: 9.8 mg/dL (ref 8.4–10.5)
Chloride: 104 mEq/L (ref 96–112)
Creatinine, Ser: 1.26 mg/dL (ref 0.40–1.50)
GFR: 63.13 mL/min (ref >60.00)
Glucose, Bld: 92 mg/dL (ref 70–99)
Potassium: 4.3 mEq/L (ref 3.5–5.1)
Sodium: 139 mEq/L (ref 135–145)
Total Bilirubin: 0.5 mg/dL (ref 0.2–1.2)
Total Protein: 7.1 g/dL (ref 6.0–8.3)

## 2021-12-19 LAB — URINALYSIS, ROUTINE W REFLEX MICROSCOPIC
Bilirubin Urine: NEGATIVE
Hgb urine dipstick: NEGATIVE
Ketones, ur: NEGATIVE
Leukocytes,Ua: NEGATIVE
Nitrite: NEGATIVE
RBC / HPF: NONE SEEN (ref 0–?)
Specific Gravity, Urine: >=1.03 — AB (ref 1.000–1.030)
Total Protein, Urine: NEGATIVE
Urine Glucose: NEGATIVE
Urobilinogen, UA: 0.2 (ref 0.0–1.0)
pH: 5.5 (ref 5.0–8.0)

## 2021-12-19 LAB — CBC WITH DIFFERENTIAL/PLATELET
Basophils Absolute: 0 10*3/uL (ref 0.0–0.1)
Basophils Relative: 0.5 % (ref 0.0–3.0)
Eosinophils Absolute: 0.1 10*3/uL (ref 0.0–0.7)
Eosinophils Relative: 1.7 % (ref 0.0–5.0)
HCT: 46.5 % (ref 39.0–52.0)
Hemoglobin: 15.3 g/dL (ref 13.0–17.0)
Lymphocytes Relative: 25.4 % (ref 12.0–46.0)
Lymphs Abs: 1.7 10*3/uL (ref 0.7–4.0)
MCHC: 32.8 g/dL (ref 30.0–36.0)
MCV: 85.3 fl (ref 78.0–100.0)
Monocytes Absolute: 0.4 10*3/uL (ref 0.1–1.0)
Monocytes Relative: 6.3 % (ref 3.0–12.0)
Neutro Abs: 4.4 10*3/uL (ref 1.4–7.7)
Neutrophils Relative %: 66.1 % (ref 43.0–77.0)
Platelets: 197 10*3/uL (ref 150.0–400.0)
RBC: 5.45 Mil/uL (ref 4.22–5.81)
RDW: 14.6 % (ref 11.5–15.5)
WBC: 6.7 10*3/uL (ref 4.0–10.5)

## 2021-12-19 NOTE — Assessment & Plan Note (Addendum)
Noted last year  during screening .  Sent to GI;  Colonoscopy without EGD done in 2021.  No longer taking iron  Supplements.  Repeat labs  Are normal.   Lab Results  Component Value Date   IRON 56 12/18/2021   TIBC 366 12/18/2021   FERRITIN 131 12/18/2021   Lab Results  Component Value Date   WBC 6.7 12/18/2021   HGB 15.3 12/18/2021   HCT 46.5 12/18/2021   MCV 85.3 12/18/2021   PLT 197.0 12/18/2021

## 2021-12-19 NOTE — Assessment & Plan Note (Addendum)
Recent spontaneous passage of a HUMONGOUS stone reviewed.  Stone analaysis deferred by patient.  Checking UA and abd films today, UA notes elevated SG and calcium oxalate crystals, without hematuria or pyuria  Continue aggressive hydration and citric acid supplementation   rx for tamsulosin given for prn use if he has a recurrence

## 2021-12-19 NOTE — Assessment & Plan Note (Signed)
Managed with losartan  50 mg daily  Lab Results  Component Value Date   MICROALBUR 3.8 (H) 06/14/2021   MICROALBUR 2.0 (H) 02/11/2017

## 2021-12-19 NOTE — Assessment & Plan Note (Addendum)
Managed with Lorrin Mais (prescribed by Dr Tami Ribas)  and  1/2 tablet alprazolam for early wakeups. .  The risks and benefits of benzodiazepine  use were reviewed with patient today including excessive sedation leading to respiratory depression,  impaired thinking/driving, and addiction.  Patient was advised to avoid concurrent use with alcohol, to use medication only as needed, not to exceed 1 mg and  and not to share with others.

## 2021-12-19 NOTE — Assessment & Plan Note (Signed)
Complicated by obesity and hypertension.   Continue mounjaro and ARB    Lab Results  Component Value Date   HGBA1C 6.2 06/14/2021

## 2021-12-20 LAB — FOLATE RBC: RBC Folate: 432 ng/mL RBC (ref >280)

## 2021-12-20 LAB — IRON,TIBC AND FERRITIN PANEL
%SAT: 15 % (calc) — ABNORMAL LOW (ref 20–48)
Ferritin: 131 ng/mL (ref 38–380)
Iron: 56 ug/dL (ref 50–180)
TIBC: 366 mcg/dL (calc) (ref 250–425)

## 2021-12-21 ENCOUNTER — Telehealth: Payer: Self-pay

## 2021-12-21 NOTE — Telephone Encounter (Signed)
LMTCB in regards to lab results.  

## 2021-12-22 ENCOUNTER — Telehealth: Payer: Self-pay | Admitting: Internal Medicine

## 2021-12-22 NOTE — Telephone Encounter (Signed)
error 

## 2022-01-01 ENCOUNTER — Ambulatory Visit (INDEPENDENT_AMBULATORY_CARE_PROVIDER_SITE_OTHER): Payer: No Typology Code available for payment source | Admitting: *Deleted

## 2022-01-01 ENCOUNTER — Other Ambulatory Visit: Payer: Self-pay

## 2022-01-01 DIAGNOSIS — E538 Deficiency of other specified B group vitamins: Secondary | ICD-10-CM | POA: Diagnosis not present

## 2022-01-01 MED ORDER — CYANOCOBALAMIN 1000 MCG/ML IJ SOLN
1000.0000 ug | Freq: Once | INTRAMUSCULAR | Status: AC
  Administered 2022-01-01: 1000 ug via INTRAMUSCULAR

## 2022-01-01 NOTE — Progress Notes (Signed)
Patient received B12 injection IM in right arm, tolerated it well.

## 2022-01-16 ENCOUNTER — Other Ambulatory Visit: Payer: Self-pay | Admitting: Internal Medicine

## 2022-01-29 ENCOUNTER — Ambulatory Visit (INDEPENDENT_AMBULATORY_CARE_PROVIDER_SITE_OTHER): Payer: No Typology Code available for payment source | Admitting: *Deleted

## 2022-01-29 ENCOUNTER — Other Ambulatory Visit: Payer: Self-pay

## 2022-01-29 DIAGNOSIS — E538 Deficiency of other specified B group vitamins: Secondary | ICD-10-CM | POA: Diagnosis not present

## 2022-01-29 MED ORDER — CYANOCOBALAMIN 1000 MCG/ML IJ SOLN
1000.0000 ug | Freq: Once | INTRAMUSCULAR | Status: AC
  Administered 2022-01-29: 1000 ug via INTRAMUSCULAR

## 2022-01-29 NOTE — Progress Notes (Signed)
Pt arrived for B12 injection, given in L deltoid. Pt tolerated injection well, showed no signs of distress nor voiced any concerns.  ?

## 2022-02-12 ENCOUNTER — Other Ambulatory Visit: Payer: Self-pay | Admitting: Internal Medicine

## 2022-02-15 ENCOUNTER — Telehealth: Payer: Self-pay

## 2022-02-15 NOTE — Telephone Encounter (Signed)
PA for Bryan Galloway has been submitted.  ?

## 2022-02-28 ENCOUNTER — Other Ambulatory Visit: Payer: Self-pay | Admitting: Internal Medicine

## 2022-03-01 ENCOUNTER — Ambulatory Visit (INDEPENDENT_AMBULATORY_CARE_PROVIDER_SITE_OTHER): Payer: No Typology Code available for payment source

## 2022-03-01 DIAGNOSIS — E538 Deficiency of other specified B group vitamins: Secondary | ICD-10-CM | POA: Diagnosis not present

## 2022-03-01 MED ORDER — CYANOCOBALAMIN 1000 MCG/ML IJ SOLN
1000.0000 ug | Freq: Once | INTRAMUSCULAR | Status: AC
  Administered 2022-03-01: 1000 ug via INTRAMUSCULAR

## 2022-03-01 NOTE — Progress Notes (Signed)
Pt arrived for B12 injection, given in L deltoid. Pt tolerated injection well, showed no signs of distress nor voiced any concerns.  ?

## 2022-04-03 ENCOUNTER — Ambulatory Visit: Payer: No Typology Code available for payment source | Admitting: Internal Medicine

## 2022-04-03 ENCOUNTER — Encounter: Payer: Self-pay | Admitting: Internal Medicine

## 2022-04-03 VITALS — BP 132/78 | HR 42 | Temp 98.1°F | Ht 75.0 in | Wt 269.0 lb

## 2022-04-03 DIAGNOSIS — F5101 Primary insomnia: Secondary | ICD-10-CM

## 2022-04-03 DIAGNOSIS — I10 Essential (primary) hypertension: Secondary | ICD-10-CM

## 2022-04-03 DIAGNOSIS — R7303 Prediabetes: Secondary | ICD-10-CM

## 2022-04-03 DIAGNOSIS — E781 Pure hyperglyceridemia: Secondary | ICD-10-CM | POA: Diagnosis not present

## 2022-04-03 DIAGNOSIS — E538 Deficiency of other specified B group vitamins: Secondary | ICD-10-CM

## 2022-04-03 DIAGNOSIS — Z6837 Body mass index (BMI) 37.0-37.9, adult: Secondary | ICD-10-CM

## 2022-04-03 MED ORDER — TIRZEPATIDE 5 MG/0.5ML ~~LOC~~ SOAJ: 5.0000 mg | SUBCUTANEOUS | 2 refills | Status: DC

## 2022-04-03 MED ORDER — ALPRAZOLAM 1 MG PO TABS: ORAL_TABLET | ORAL | 5 refills | Status: DC

## 2022-04-03 MED ORDER — ZOLPIDEM TARTRATE 10 MG PO TABS: 10.0000 mg | ORAL_TABLET | Freq: Every evening | ORAL | 5 refills | Status: DC | PRN

## 2022-04-03 NOTE — Patient Instructions (Signed)
Agree with increasing dose of Mounjaro to 5 mg weekly ? ?Dose can be increased  again after 4 doses if weight plateaus and no persistent nausea ? ? ?

## 2022-04-03 NOTE — Assessment & Plan Note (Addendum)
Well controlled on current regimen of amlodipine 10 mg and losartan 50 mg .  Home readings are similar . Renal function is due  no changes today. ?

## 2022-04-03 NOTE — Progress Notes (Signed)
? ?Subjective:  ?Patient ID: Bryan Galloway, male    DOB: 22-Dec-1965  Age: 56 y.o. MRN: [id number] ? ?CC: The primary encounter diagnosis was Essential hypertension. Diagnoses of Prediabetes, Hypertriglyceridemia, Class 2 severe obesity due to excess calories with serious comorbidity and body mass index (BMI) of 37.0 to 37.9 in adult [facility]), Primary insomnia, and B12 deficiency were also pertinent to this visit. ? ?HPI ?Bryan Galloway presents for follow up on multiple issues  ?Chief Complaint  ?Patient presents with  ? Follow-up  ?  Follow up on hypertension, prediabetes and medication refills.   ? ?1) Chronic insomnia:  using ambien 10 mg nightly without falls or other adverse events ? ?2) Obesity:  has lost 11 lbs with initial dose of mounjaro.  Has not increased dose yet.  Not exercising except for walks 3 days per week  ? ?3) Prediabetes: last a1c was 6.2 in July .  He has been reducing his starches and concentrated sugars  ? ?4) Hypertension: patient checks blood pressure twice weekly at home.  Readings have been for the most part  <> 140/80 at rest . Patient is following a reduce salt diet most days and is taking medications as prescribed  ? ? ?Outpatient Medications Prior to Visit  ?Medication Sig Dispense Refill  ? amLODipine (NORVASC) 10 MG tablet TAKE 1 TABLET BY MOUTH DAILY 90 tablet 1  ? cyclobenzaprine (FLEXERIL) 10 MG tablet Take 1 tablet (10 mg total) by mouth 3 (three) times daily as needed. 30 tablet 2  ? diclofenac (VOLTAREN) 75 MG EC tablet TAKE 1 TABLET BY MOUTH TWICE DAILY 60 tablet 1  ? Diclofenac Sodium (PENNSAID) 2 % SOLN Apply 1 application topically 4 (four) times daily as needed. 112 g 3  ? gabapentin (NEURONTIN) 100 MG capsule TAKE ONE CAPSULE AT BEDTIME FOR NEUROPATHY. INCREASE GRADUALLY AS NEEDED 90 capsule 3  ? losartan (COZAAR) 50 MG tablet TAKE 1 TABLET BY MOUTH AT BEDTIME. 90 tablet 0  ? traMADol (ULTRAM) 50 MG tablet Take 1 tablet (50 mg total) by mouth every 6 (six) hours  as needed. 90 tablet 1  ? ALPRAZolam (XANAX) 1 MG tablet TAKE ONE-HALF (1/2) TABLET (0.5 MG) BY MOUTH TWICE DAILY AS NEEDED FOR ANXIETY 30 tablet 5  ? tirzepatide (MOUNJARO) 2.5 MG/0.5ML Pen INJECT 2.$RemoveBefo'5MG'ilwUJsDOPWz$  INtO SKIN ONCE A WEEK 2 mL 2  ? zolpidem (AMBIEN) 10 MG tablet TAKE 1 TABLET BY MOUTH AT BEDTIME AS NEEDED FOR SLEEP 30 tablet 5  ? benzonatate (TESSALON) 200 MG capsule Take 200 mg by mouth 3 (three) times daily as needed. (Patient not taking: Reported on 04/03/2022)    ? doxycycline (VIBRA-TABS) 100 MG tablet Take 1 tablet (100 mg total) by mouth 2 (two) times daily. (Patient not taking: Reported on 04/03/2022) 10 tablet 0  ? FeFum-FePo-FA-B Cmp-C-Zn-Mn-Cu (SE-TAN PLUS) 162-115.2-1 MG CAPS TAKE 1 CAPSULE BY MOUTH DAILY WITH BREAKFAST (Patient not taking: Reported on 04/03/2022) 30 capsule 5  ? ferrous fumarate-iron polysaccharide complex (TANDEM) 162-115.2 MG CAPS capsule Take 1 capsule by mouth daily with breakfast. (Patient not taking: Reported on 04/03/2022) 30 capsule 2  ? tamsulosin (FLOMAX) 0.4 MG CAPS capsule TAKE 1 CAPSULE BY MOUTH EVERY DAY TO HELP PASS KIDNEY STONE (Patient not taking: Reported on 04/03/2022) 30 capsule 3  ? Vitamin D, Ergocalciferol, (DRISDOL) 1.25 MG (50000 UNIT) CAPS capsule TAKE 1 CAPSULE BY MOUTH ONCE WEEKLY (Patient not taking: Reported on 04/03/2022) 12 capsule 1  ? ?Facility-Administered Medications Prior to Visit  ?Medication  Dose Route Frequency Provider Last Rate Last Admin  ? betamethasone acetate-betamethasone sodium phosphate (CELESTONE) injection 3 mg  3 mg Intra-articular Once Edrick Kins, DPM      ? diclofenac Sodium (VOLTAREN) 1 % topical gel 2 g  2 g Topical QID Edrick Kins, DPM      ? ? ?Review of Systems; ? ?Patient denies headache, fevers, malaise, unintentional weight loss, skin rash, eye pain, sinus congestion and sinus pain, sore throat, dysphagia,  hemoptysis , cough, dyspnea, wheezing, chest pain, palpitations, orthopnea, edema, abdominal pain, nausea, melena,  diarrhea, constipation, flank pain, dysuria, hematuria, urinary  Frequency, nocturia, numbness, tingling, seizures,  Focal weakness, Loss of consciousness,  Tremor, insomnia, depression, anxiety, and suicidal ideation.   ? ? ? ?Objective:  ?BP 132/78 (BP Location: Left Arm, Patient Position: Sitting, Cuff Size: Large)   Pulse (!) 42   Temp 98.1 ?F (36.7 ?C) (Oral)   Ht $R'6\' 3"'Wp$  (1.905 m)   Wt 269 lb (122 kg)   SpO2 96%   BMI 33.62 kg/m?  ? ?BP Readings from Last 3 Encounters:  ?04/03/22 132/78  ?12/18/21 122/78  ?09/15/21 128/86  ? ? ?Wt Readings from Last 3 Encounters:  ?04/03/22 269 lb (122 kg)  ?12/18/21 271 lb 3.2 oz (123 kg)  ?09/15/21 280 lb 9.6 oz (127.3 kg)  ? ? ?General appearance: alert, cooperative and appears stated age ?Ears: normal TM's and external ear canals both ears ?Throat: lips, mucosa, and tongue normal; teeth and gums normal ?Neck: no adenopathy, no carotid bruit, supple, symmetrical, trachea midline and thyroid not enlarged, symmetric, no tenderness/mass/nodules ?Back: symmetric, no curvature. ROM normal. No CVA tenderness. ?Lungs: clear to auscultation bilaterally ?Heart: regular rate and rhythm, S1, S2 normal, no murmur, click, rub or gallop ?Abdomen: soft, non-tender; bowel sounds normal; no masses,  no organomegaly ?Pulses: 2+ and symmetric ?Skin: Skin color, texture, turgor normal. No rashes or lesions ?Lymph nodes: Cervical, supraclavicular, and axillary nodes normal. ? ?Lab Results  ?Component Value Date  ? HGBA1C 5.8 04/03/2022  ? HGBA1C 6.2 06/14/2021  ? HGBA1C 5.9 02/11/2020  ? ? ?Lab Results  ?Component Value Date  ? CREATININE 1.32 04/03/2022  ? CREATININE 1.26 12/18/2021  ? CREATININE 1.29 06/14/2021  ? ? ?Lab Results  ?Component Value Date  ? WBC 6.7 12/18/2021  ? HGB 15.3 12/18/2021  ? HCT 46.5 12/18/2021  ? PLT 197.0 12/18/2021  ? GLUCOSE 108 (H) 04/03/2022  ? CHOL 173 04/03/2022  ? TRIG 253.0 (H) 04/03/2022  ? HDL 38.90 (L) 04/03/2022  ? LDLDIRECT 101.0 04/03/2022  ?  LDLCALC 112 (H) 06/14/2021  ? ALT 17 04/03/2022  ? AST 14 04/03/2022  ? NA 139 04/03/2022  ? K 4.0 04/03/2022  ? CL 104 04/03/2022  ? CREATININE 1.32 04/03/2022  ? BUN 14 04/03/2022  ? CO2 25 04/03/2022  ? TSH 1.07 02/11/2020  ? PSA 0.50 06/14/2021  ? HGBA1C 5.8 04/03/2022  ? MICROALBUR 1.3 04/03/2022  ? ? ?No results found. ? ?Assessment & Plan:  ? ?Problem List Items Addressed This Visit   ? ? Hypertriglyceridemia  ? Relevant Orders  ? Lipid Profile (Completed)  ? Direct LDL (Completed)  ? B12 deficiency  ? Essential hypertension - Primary  ?  Well controlled on current regimen of amlodipine 10 mg and losartan 50 mg .  Home readings are similar . Renal function is due  no changes today. ? ?  ?  ? Relevant Orders  ? Comp Met (CMET) (Completed)  ?  Urine Microalbumin w/creat. ratio (Completed)  ? Insomnia  ?  Managed with ambien 10 mg daily ? ?  ?  ? Class 2 severe obesity due to excess calories with serious comorbidity and body mass index (BMI) of 37.0 to 37.9 in adult [facility])  ?  Tolerating mounjaro but weight has plateaued. Increase dose to 5 mg daily .  Encouraged to increase his activity level.  ? ?  ?  ? Relevant Medications  ? tirzepatide [facility]) 5 MG/0.5ML Pen  ? Prediabetes  ?  Complicated by obesity and hypertension.   A1c has improved from 6.2 to 5.8   Continue mounjaro and ARB  , enocuarged to exercise  ? ?Lab Results  ?Component Value Date  ? HGBA1C 5.8 04/03/2022  ? ? ?  ?  ? Relevant Orders  ? HgB A1c (Completed)  ? Comp Met (CMET) (Completed)  ? ? ?I spent a total of   minutes with this patient in a face to face visit on the date of this encounter reviewing  his most recent with his cardiologist on    ,  his diet and eating habits   his blood pressure readings ,  last office visit with me in this visit  in      and post visit ordering of testing and therapeutics.   ? ?Follow-up: No follow-ups on file. ? ? ?[name], MD ?

## 2022-04-03 NOTE — Assessment & Plan Note (Addendum)
Tolerating mounjaro but weight has plateaued. Increase dose to 5 mg daily .  Encouraged to increase his activity level.  ?

## 2022-04-03 NOTE — Assessment & Plan Note (Signed)
Managed with ambien 10 mg daily ?

## 2022-04-04 LAB — LIPID PANEL
Cholesterol: 173 mg/dL (ref 0–200)
HDL: 38.9 mg/dL — ABNORMAL LOW (ref >39.00)
NonHDL: 133.65
Total CHOL/HDL Ratio: 4
Triglycerides: 253 mg/dL — ABNORMAL HIGH (ref 0.0–149.0)
VLDL: 50.6 mg/dL — ABNORMAL HIGH (ref 0.0–40.0)

## 2022-04-04 LAB — LDL CHOLESTEROL, DIRECT: Direct LDL: 101 mg/dL

## 2022-04-04 LAB — MICROALBUMIN / CREATININE URINE RATIO
Creatinine,U: 168.9 mg/dL
Microalb Creat Ratio: 0.8 mg/g (ref 0.0–30.0)
Microalb, Ur: 1.3 mg/dL (ref 0.0–1.9)

## 2022-04-04 LAB — COMPREHENSIVE METABOLIC PANEL
ALT: 17 U/L (ref 0–53)
AST: 14 U/L (ref 0–37)
Albumin: 4.6 g/dL (ref 3.5–5.2)
Alkaline Phosphatase: 52 U/L (ref 39–117)
BUN: 14 mg/dL (ref 6–23)
CO2: 25 mEq/L (ref 19–32)
Calcium: 9.7 mg/dL (ref 8.4–10.5)
Chloride: 104 mEq/L (ref 96–112)
Creatinine, Ser: 1.32 mg/dL (ref 0.40–1.50)
GFR: 59.58 mL/min — ABNORMAL LOW (ref >60.00)
Glucose, Bld: 108 mg/dL — ABNORMAL HIGH (ref 70–99)
Potassium: 4 mEq/L (ref 3.5–5.1)
Sodium: 139 mEq/L (ref 135–145)
Total Bilirubin: 0.5 mg/dL (ref 0.2–1.2)
Total Protein: 7.1 g/dL (ref 6.0–8.3)

## 2022-04-04 LAB — HEMOGLOBIN A1C: Hgb A1c MFr Bld: 5.8 % (ref 4.6–6.5)

## 2022-04-04 MED ORDER — CYANOCOBALAMIN 1000 MCG/ML IJ SOLN
1000.0000 ug | Freq: Once | INTRAMUSCULAR | Status: AC
  Administered 2022-04-03: 1000 ug via INTRAMUSCULAR

## 2022-04-04 NOTE — Assessment & Plan Note (Signed)
Complicated by obesity and hypertension.   A1c has improved from 6.2 to 5.8   Continue mounjaro and ARB  , enocuarged to exercise  ? ?Lab Results  ?Component Value Date  ? HGBA1C 5.8 04/03/2022  ? ? ?

## 2022-04-09 ENCOUNTER — Telehealth: Payer: Self-pay

## 2022-04-09 NOTE — Telephone Encounter (Signed)
LMTCB in regards to lab results.  

## 2022-04-09 NOTE — Telephone Encounter (Signed)
-----   Message from [name], MD sent at 04/04/2022  8:27 PM EDT ----- ?Your a1c has improved to 5.8, and your LDL has dropped to 101.  Your other labs are unchanged.  Please plan to repeat your non fasting labs in 6 months ?

## 2022-04-25 ENCOUNTER — Other Ambulatory Visit: Payer: Self-pay | Admitting: Internal Medicine

## 2022-05-04 ENCOUNTER — Other Ambulatory Visit: Payer: Self-pay | Admitting: Internal Medicine

## 2022-05-04 MED ORDER — GABAPENTIN 100 MG PO CAPS: ORAL_CAPSULE | ORAL | 3 refills | Status: DC

## 2022-05-04 NOTE — Telephone Encounter (Signed)
Thank you Carolee Rota,  prescription has been signed

## 2022-05-04 NOTE — Telephone Encounter (Signed)
[facility] called about the message below

## 2022-05-04 NOTE — Telephone Encounter (Signed)
Pt notified that refill has been sent

## 2022-05-04 NOTE — Telephone Encounter (Signed)
I have updated the Rx after speaking to the patient to find out exactly how he is taking it & pended for your approval. Pt stated that he is taking 3 pills a day. 1 QAM, & 2 QHS. [place]

## 2022-05-04 NOTE — Telephone Encounter (Signed)
Pt called stating that he needs a refill on gabapentin sent to [facility]. The pharmacy states the directions are not clear. Pt states he gets 3 pills a day

## 2022-05-07 ENCOUNTER — Ambulatory Visit (INDEPENDENT_AMBULATORY_CARE_PROVIDER_SITE_OTHER): Payer: No Typology Code available for payment source

## 2022-05-07 DIAGNOSIS — E538 Deficiency of other specified B group vitamins: Secondary | ICD-10-CM | POA: Diagnosis not present

## 2022-05-07 MED ORDER — CYANOCOBALAMIN 1000 MCG/ML IJ SOLN
1000.0000 ug | Freq: Once | INTRAMUSCULAR | Status: AC
  Administered 2022-05-07: 1000 ug via INTRAMUSCULAR

## 2022-05-07 NOTE — Progress Notes (Addendum)
Patient presented for B 12 injection to right deltoid, patient voiced no concerns nor showed any signs of distress during injection. 

## 2022-05-27 ENCOUNTER — Other Ambulatory Visit: Payer: Self-pay | Admitting: Internal Medicine

## 2022-05-28 NOTE — Telephone Encounter (Signed)
Not in current medication list.   Refilled: 01/16/2022 Last OV: 04/03/2022 Next OV: not scheduled.  Last Vitamin D level was normal 02/11/2020

## 2022-06-06 ENCOUNTER — Ambulatory Visit (INDEPENDENT_AMBULATORY_CARE_PROVIDER_SITE_OTHER): Payer: No Typology Code available for payment source

## 2022-06-06 DIAGNOSIS — E538 Deficiency of other specified B group vitamins: Secondary | ICD-10-CM

## 2022-06-06 MED ORDER — CYANOCOBALAMIN 1000 MCG/ML IJ SOLN
1000.0000 ug | Freq: Once | INTRAMUSCULAR | Status: AC
  Administered 2022-06-06: 1000 ug via INTRAMUSCULAR

## 2022-06-06 NOTE — Progress Notes (Signed)
Patient presented for B 12 injection to left deltoid, patient voiced no concerns nor showed any signs of distress during injection. 

## 2022-06-13 ENCOUNTER — Telehealth: Payer: Self-pay | Admitting: Internal Medicine

## 2022-06-13 DIAGNOSIS — R7303 Prediabetes: Secondary | ICD-10-CM

## 2022-06-13 MED ORDER — SEMAGLUTIDE-WEIGHT MANAGEMENT 0.5 MG/0.5ML ~~LOC~~ SOAJ: 0.5000 mg | SUBCUTANEOUS | 2 refills | Status: DC

## 2022-06-13 NOTE — Addendum Note (Signed)
Addended by: [name] on: 06/13/2022 12:50 PM   Modules accepted: Orders

## 2022-06-13 NOTE — Telephone Encounter (Signed)
Spoke to Patient he agrees with trying [facility] and if that does not work then he will try the [place] .

## 2022-06-13 NOTE — Telephone Encounter (Signed)
Pt called stating the mounjaro coupon that was being used at the pharmacy had expired and the price now is 425. Pt would like for kathy to call him

## 2022-06-13 NOTE — Telephone Encounter (Signed)
Patient can not afford Mounjaro anymore and would like to know if there is another medication that is cheaper?

## 2022-06-13 NOTE — Telephone Encounter (Signed)
I have sent the comparable dose of Wegovy to [facility].  It is very similar, and is injected weekly.  If Bryan Galloway is also too expensive ,  the alternative  we will try is Saxenda,  same type of medication but has to be  injected daily .  If Bryan Galloway is not an option he'll need to make an appt to discuss alternatives

## 2022-06-15 NOTE — Telephone Encounter (Signed)
Spoke with pharmacy and they stated that the wegovy is on back order. Is it okay to send in [place] instead.

## 2022-06-15 NOTE — Telephone Encounter (Signed)
Pharmacy called stating pt need PA for [facility]

## 2022-06-16 MED ORDER — SAXENDA 18 MG/3ML ~~LOC~~ SOPN: 0.6000 mg | PEN_INJECTOR | Freq: Every day | SUBCUTANEOUS | 0 refills | Status: DC

## 2022-06-16 NOTE — Addendum Note (Signed)
Addended by: [name] on: 06/16/2022 12:27 AM   Modules accepted: Orders

## 2022-06-27 NOTE — Telephone Encounter (Signed)
Pharmacy need PA for saxenda

## 2022-06-28 ENCOUNTER — Telehealth: Payer: Self-pay

## 2022-06-28 NOTE — Telephone Encounter (Signed)
I was having a hard time finding patient on [email or web address] web site so I called to speak with customer service advocate. Spoke with Stacy & she was able to locate & process PA. Unfortunately coverage for Saxenda denied. Any weightless med is a plan exclusion with patient's insurance.

## 2022-06-28 NOTE — Telephone Encounter (Signed)
PA has been started via [email or web address] on 06/28/22 for pt's SAXENDA 18 MG/3 ML PEN  Prior Auth (EOC) [id number] Drug/Service Name: SAXENDA 18 MG/3 ML PEN [place] Date Requested:06/28/2022 9:26:52 AM   HHIDUPBD:[id number] DOB:25-Feb-1966  Awaiting denial or approval

## 2022-07-02 NOTE — Telephone Encounter (Signed)
Pt is scheduled 8/16.

## 2022-07-03 NOTE — Telephone Encounter (Signed)
PA has been denied. Insurance stated that "the requested product is a plan-design exclusion and is not covered at this time."

## 2022-07-09 ENCOUNTER — Ambulatory Visit: Payer: No Typology Code available for payment source

## 2022-07-11 ENCOUNTER — Encounter: Payer: Self-pay | Admitting: Internal Medicine

## 2022-07-11 ENCOUNTER — Ambulatory Visit: Payer: No Typology Code available for payment source | Admitting: Internal Medicine

## 2022-07-11 VITALS — BP 126/64 | HR 50 | Temp 98.1°F | Ht 75.0 in | Wt 258.0 lb

## 2022-07-11 DIAGNOSIS — E663 Overweight: Secondary | ICD-10-CM

## 2022-07-11 DIAGNOSIS — R7303 Prediabetes: Secondary | ICD-10-CM

## 2022-07-11 DIAGNOSIS — E538 Deficiency of other specified B group vitamins: Secondary | ICD-10-CM

## 2022-07-11 MED ORDER — METFORMIN HCL ER 500 MG PO TB24: 500.0000 mg | ORAL_TABLET | Freq: Every day | ORAL | 1 refills | Status: DC

## 2022-07-11 MED ORDER — CYANOCOBALAMIN 1000 MCG/ML IJ SOLN
1000.0000 ug | Freq: Once | INTRAMUSCULAR | Status: AC
  Administered 2022-07-11: 1000 ug via INTRAMUSCULAR

## 2022-07-11 NOTE — Patient Instructions (Addendum)
You've done great on the medication ,  hopefully it will become "covered" after the new year.   In the meantime:  [employer] "low carb power bowl"  entrees (most are < 200 cal)   Trial of metformin XR once daily  in early afternoon .  Increase to 2 tablets after 2 weeks if no change in weight    Trial of  adding benefiber to an 8 ounce glass of water  once daily    Try to limit calories to 1400-1500 daily    Consider using "Noom"  to help with weight management    Try club soda with a splash of "[place]"  and a squeeze of lime for your ginger ale substitute

## 2022-07-11 NOTE — Progress Notes (Signed)
Subjective:  Patient ID: Bryan Galloway, male    DOB: 18-Jan-1966  Age: 56 y.o. MRN: [id number]  CC: The primary encounter diagnosis was B12 deficiency. Diagnoses of Prediabetes and Overweight (BMI 25.0-29.9) were also pertinent to this visit.   HPI Bryan Galloway presents for alternative therapy for ongoing management of obesity Chief Complaint  Patient presents with   Follow-up    Follow up to discuss weight loss options    Obesity: he lost 8 lbs before starting mounjaro in Oct 2022 and another 22 lbs from Oct to present,  now unable to obtain [facility], [place], [place] due to insurance non coverage. Last dose was 3-4 weeks ago,  has gained 3 lbs.   Hungry in the afternoon .  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  Dinner is often > 500 cal due to wife's preferences.  Has reduced alcohol consumption .   Exercise:  walking for 30 minutes daily in his neighborhood.    Outpatient Medications Prior to Visit  Medication Sig Dispense Refill   ALPRAZolam (XANAX) 1 MG tablet TAKE ONE-HALF (1/2) TABLET (0.5 MG) BY MOUTH TWICE DAILY AS NEEDED FOR ANXIETY 30 tablet 5   amLODipine (NORVASC) 10 MG tablet TAKE 1 TABLET BY MOUTH DAILY 90 tablet 3   cyclobenzaprine (FLEXERIL) 10 MG tablet TAKE 1 TABLET BY MOUTH 3 TIMES DAILY AS NEEDED. 30 tablet 2   diclofenac (VOLTAREN) 75 MG EC tablet TAKE 1 TABLET BY MOUTH TWICE DAILY 60 tablet 1   gabapentin (NEURONTIN) 100 MG capsule Take one in the morning, & take two at bedtime. 270 capsule 3   losartan (COZAAR) 50 MG tablet TAKE 1 TABLET BY MOUTH AT BEDTIME 90 tablet 3   traMADol (ULTRAM) 50 MG tablet Take 1 tablet (50 mg total) by mouth every 6 (six) hours as needed. 90 tablet 1   zolpidem (AMBIEN) 10 MG tablet Take 1 tablet (10 mg total) by mouth at bedtime as needed. for sleep 30 tablet 5   Diclofenac Sodium (PENNSAID) 2 % SOLN Apply 1 application topically 4 (four) times daily as needed. (Patient not taking:  Reported on 07/11/2022) 112 g 3   Liraglutide -Weight Management (SAXENDA) 18 MG/3ML SOPN Inject 0.6 mg into the skin daily. Increase dose weekly as follows: Week 2: 1.2 mg daily ; Week 3: 1.8 mg daily; Week 4: 2.4 mg daily (Patient not taking: Reported on 07/11/2022) 15 mL 0   Facility-Administered Medications Prior to Visit  Medication Dose Route Frequency Provider Last Rate Last Admin   betamethasone acetate-betamethasone sodium phosphate (CELESTONE) injection 3 mg  3 mg Intra-articular Once Edrick Kins, DPM       diclofenac Sodium (VOLTAREN) 1 % topical gel 2 g  2 g Topical QID Edrick Kins, DPM        Review of Systems;  Patient denies headache, fevers, malaise, unintentional weight loss, skin rash, eye pain, sinus congestion and sinus pain, sore throat, dysphagia,  hemoptysis , cough, dyspnea, wheezing, chest pain, palpitations, orthopnea, edema, abdominal pain, nausea, melena, diarrhea, constipation, flank pain, dysuria, hematuria, urinary  Frequency, nocturia, numbness, tingling, seizures,  Focal weakness, Loss of consciousness,  Tremor, insomnia, depression, anxiety, and suicidal ideation.      Objective:  BP 126/64 (BP Location: Left Arm, Patient Position: Sitting, Cuff Size: Large)   Pulse (!) 50   Temp 98.1 F (36.7 C) (Oral)   Ht '6\' 3"'$  (1.905 m)   Wt 258  lb (117 kg)   SpO2 97%   BMI 32.25 kg/m   BP Readings from Last 3 Encounters:  07/11/22 126/64  04/03/22 132/78  12/18/21 122/78    Wt Readings from Last 3 Encounters:  07/11/22 258 lb (117 kg)  04/03/22 269 lb (122 kg)  12/18/21 271 lb 3.2 oz (123 kg)    General appearance: alert, cooperative and appears stated age Ears: normal TM's and external ear canals both ears Throat: lips, mucosa, and tongue normal; teeth and gums normal Neck: no adenopathy, no carotid bruit, supple, symmetrical, trachea midline and thyroid not enlarged, symmetric, no tenderness/mass/nodules Back: symmetric, no curvature. ROM normal.  No CVA tenderness. Lungs: clear to auscultation bilaterally Heart: regular rate and rhythm, S1, S2 normal, no murmur, click, rub or gallop Abdomen: soft, non-tender; bowel sounds normal; no masses,  no organomegaly Pulses: 2+ and symmetric Skin: Skin color, texture, turgor normal. No rashes or lesions Lymph nodes: Cervical, supraclavicular, and axillary nodes normal.  Lab Results  Component Value Date   HGBA1C 5.8 04/03/2022   HGBA1C 6.2 06/14/2021   HGBA1C 5.9 02/11/2020    Lab Results  Component Value Date   CREATININE 1.32 04/03/2022   CREATININE 1.26 12/18/2021   CREATININE 1.29 06/14/2021    Lab Results  Component Value Date   WBC 6.7 12/18/2021   HGB 15.3 12/18/2021   HCT 46.5 12/18/2021   PLT 197.0 12/18/2021   GLUCOSE 108 (H) 04/03/2022   CHOL 173 04/03/2022   TRIG 253.0 (H) 04/03/2022   HDL 38.90 (L) 04/03/2022   LDLDIRECT 101.0 04/03/2022   LDLCALC 112 (H) 06/14/2021   ALT 17 04/03/2022   AST 14 04/03/2022   NA 139 04/03/2022   K 4.0 04/03/2022   CL 104 04/03/2022   CREATININE 1.32 04/03/2022   BUN 14 04/03/2022   CO2 25 04/03/2022   TSH 1.07 02/11/2020   PSA 0.50 06/14/2021   HGBA1C 5.8 04/03/2022   MICROALBUR 1.3 04/03/2022    No results found.  Assessment & Plan:   Problem List Items Addressed This Visit     B12 deficiency - Primary   Overweight (BMI 25.0-29.9)    He lost 22 lbs using a GLP 1 agonist for appetite suppression. Alternative methods needed:  Metformin XR and Benefiber recommended  ADVISED TO CONSIDER caloric restriction to 1400-1500 cal.  Increase exercise intensity       Prediabetes   Relevant Orders   Comprehensive metabolic panel   Lipid Panel w/reflex Direct LDL   Hemoglobin A1c    I spent a total of 30  minutes with this patient in a face to face visit on the date of this encounter reviewing the last office visit with me,, patient's diet and eating habits, home blood pressure readings ,  most recent imaging study ,   and  post visit ordering of testing and therapeutics.    Follow-up: Return in about 6 months (around 01/11/2023).   [name], MD

## 2022-07-11 NOTE — Assessment & Plan Note (Addendum)
He lost 22 lbs using a GLP 1 agonist for appetite suppression. Alternative methods needed:  Metformin XR and Benefiber recommended  ADVISED TO CONSIDER caloric restriction to 1400-1500 cal.  Increase exercise intensity

## 2022-08-13 ENCOUNTER — Ambulatory Visit (INDEPENDENT_AMBULATORY_CARE_PROVIDER_SITE_OTHER): Payer: No Typology Code available for payment source

## 2022-08-13 DIAGNOSIS — E538 Deficiency of other specified B group vitamins: Secondary | ICD-10-CM

## 2022-08-13 MED ORDER — CYANOCOBALAMIN 1000 MCG/ML IJ SOLN
1000.0000 ug | Freq: Once | INTRAMUSCULAR | Status: AC
  Administered 2022-08-13: 1000 ug via INTRAMUSCULAR

## 2022-08-13 NOTE — Progress Notes (Addendum)
Pt arrived for B12 injection, given in L deltoid. Pt tolerated injection well, showed no signs of distress nor voiced any concerns.  ?

## 2022-08-28 ENCOUNTER — Other Ambulatory Visit: Payer: Self-pay | Admitting: Internal Medicine

## 2022-09-12 ENCOUNTER — Ambulatory Visit (INDEPENDENT_AMBULATORY_CARE_PROVIDER_SITE_OTHER): Payer: No Typology Code available for payment source | Admitting: *Deleted

## 2022-09-12 DIAGNOSIS — E538 Deficiency of other specified B group vitamins: Secondary | ICD-10-CM

## 2022-09-12 MED ORDER — CYANOCOBALAMIN 1000 MCG/ML IJ SOLN
1000.0000 ug | Freq: Once | INTRAMUSCULAR | Status: AC
  Administered 2022-09-12: 1000 ug via INTRAMUSCULAR

## 2022-09-12 NOTE — Progress Notes (Signed)
Pt received B12 injection in right deltoid & tolerated it well with no concerns or complaints.

## 2022-10-03 ENCOUNTER — Other Ambulatory Visit: Payer: Self-pay | Admitting: Internal Medicine

## 2022-10-16 ENCOUNTER — Ambulatory Visit (INDEPENDENT_AMBULATORY_CARE_PROVIDER_SITE_OTHER): Payer: No Typology Code available for payment source

## 2022-10-16 DIAGNOSIS — E538 Deficiency of other specified B group vitamins: Secondary | ICD-10-CM | POA: Diagnosis not present

## 2022-10-16 MED ORDER — CYANOCOBALAMIN 1000 MCG/ML IJ SOLN
1000.0000 ug | Freq: Once | INTRAMUSCULAR | Status: AC
  Administered 2022-10-16: 1000 ug via INTRAMUSCULAR

## 2022-10-16 NOTE — Progress Notes (Addendum)
Pt presented for their vitamin B12 injection. Pt was identified through two identifiers. Pt tolerated shot well in their left deltoid.    [place], CMA

## 2022-10-30 ENCOUNTER — Other Ambulatory Visit: Payer: Self-pay | Admitting: Internal Medicine

## 2022-10-31 NOTE — Telephone Encounter (Signed)
Last filled in January for #90 with one refill  LOV: 07/11/2022   NOV: 01/16/23

## 2022-11-15 ENCOUNTER — Ambulatory Visit (INDEPENDENT_AMBULATORY_CARE_PROVIDER_SITE_OTHER): Payer: No Typology Code available for payment source

## 2022-11-15 DIAGNOSIS — E538 Deficiency of other specified B group vitamins: Secondary | ICD-10-CM | POA: Diagnosis not present

## 2022-11-15 MED ORDER — CYANOCOBALAMIN 1000 MCG/ML IJ SOLN
1000.0000 ug | Freq: Once | INTRAMUSCULAR | Status: AC
  Administered 2022-11-15: 1000 ug via INTRAMUSCULAR

## 2022-11-15 NOTE — Progress Notes (Signed)
Pt presented for their vitamin B12 injection. Pt was identified through two identifiers. Pt tolerated shot well in their right deltoid.  

## 2022-12-17 ENCOUNTER — Ambulatory Visit (INDEPENDENT_AMBULATORY_CARE_PROVIDER_SITE_OTHER): Payer: Commercial Managed Care - PPO

## 2022-12-17 DIAGNOSIS — E538 Deficiency of other specified B group vitamins: Secondary | ICD-10-CM

## 2022-12-17 MED ORDER — CYANOCOBALAMIN 1000 MCG/ML IJ SOLN
1000.0000 ug | Freq: Once | INTRAMUSCULAR | Status: AC
  Administered 2022-12-17: 1000 ug via INTRAMUSCULAR

## 2022-12-17 NOTE — Progress Notes (Signed)
Pt presented for their vitamin B12 injection. Pt was identified through two identifiers. Pt tolerated shot well in their right deltoid.  

## 2022-12-27 ENCOUNTER — Other Ambulatory Visit: Payer: Self-pay | Admitting: Internal Medicine

## 2023-01-16 ENCOUNTER — Encounter: Payer: Self-pay | Admitting: Internal Medicine

## 2023-01-16 ENCOUNTER — Ambulatory Visit: Payer: Commercial Managed Care - PPO | Admitting: Internal Medicine

## 2023-01-16 VITALS — BP 148/80 | HR 48 | Temp 98.0°F | Ht 75.0 in | Wt 286.4 lb

## 2023-01-16 DIAGNOSIS — I498 Other specified cardiac arrhythmias: Secondary | ICD-10-CM

## 2023-01-16 DIAGNOSIS — I10 Essential (primary) hypertension: Secondary | ICD-10-CM | POA: Diagnosis not present

## 2023-01-16 DIAGNOSIS — M25562 Pain in left knee: Secondary | ICD-10-CM

## 2023-01-16 DIAGNOSIS — E538 Deficiency of other specified B group vitamins: Secondary | ICD-10-CM | POA: Diagnosis not present

## 2023-01-16 DIAGNOSIS — I499 Cardiac arrhythmia, unspecified: Secondary | ICD-10-CM

## 2023-01-16 DIAGNOSIS — R7303 Prediabetes: Secondary | ICD-10-CM

## 2023-01-16 DIAGNOSIS — M25561 Pain in right knee: Secondary | ICD-10-CM

## 2023-01-16 DIAGNOSIS — G8929 Other chronic pain: Secondary | ICD-10-CM

## 2023-01-16 MED ORDER — ZEPBOUND 2.5 MG/0.5ML ~~LOC~~ SOAJ: 2.5000 mg | SUBCUTANEOUS | 2 refills | Status: DC

## 2023-01-16 MED ORDER — LOSARTAN POTASSIUM 100 MG PO TABS: 100.0000 mg | ORAL_TABLET | Freq: Every day | ORAL | 1 refills | Status: DC

## 2023-01-16 MED ORDER — TRAMADOL HCL 50 MG PO TABS: 50.0000 mg | ORAL_TABLET | Freq: Every day | ORAL | 5 refills | Status: DC | PRN

## 2023-01-16 MED ORDER — GABAPENTIN 100 MG PO CAPS: ORAL_CAPSULE | ORAL | 3 refills | Status: DC

## 2023-01-16 MED ORDER — CYCLOBENZAPRINE HCL 10 MG PO TABS: 10.0000 mg | ORAL_TABLET | Freq: Three times a day (TID) | ORAL | 2 refills | Status: DC | PRN

## 2023-01-16 MED ORDER — CYANOCOBALAMIN 1000 MCG/ML IJ SOLN
1000.0000 ug | Freq: Once | INTRAMUSCULAR | Status: AC
  Administered 2023-01-16: 1000 ug via INTRAMUSCULAR

## 2023-01-16 MED ORDER — ALPRAZOLAM 1 MG PO TABS: ORAL_TABLET | ORAL | 5 refills | Status: DC

## 2023-01-16 MED ORDER — DICLOFENAC SODIUM 75 MG PO TBEC: 75.0000 mg | DELAYED_RELEASE_TABLET | Freq: Two times a day (BID) | ORAL | 0 refills | Status: DC

## 2023-01-16 MED ORDER — METFORMIN HCL ER (MOD) 1000 MG PO TB24: 1000.0000 mg | ORAL_TABLET | Freq: Every day | ORAL | 1 refills | Status: DC

## 2023-01-16 MED ORDER — ZOLPIDEM TARTRATE 10 MG PO TABS: ORAL_TABLET | ORAL | 5 refills | Status: DC

## 2023-01-16 NOTE — Progress Notes (Signed)
Subjective:  Patient ID: Bryan Galloway, male    DOB: 1966-01-20  Age: 57 y.o. MRN: [id number]  CC: The primary encounter diagnosis was Essential hypertension. Diagnoses of Prediabetes, B12 deficiency, Morbid obesity, unspecified obesity type [facility]), Cardiac arrhythmia, unspecified cardiac arrhythmia type, Ventricular bigeminy, and Chronic pain of both knees were also pertinent to this visit.   HPI MAXXIM LASHOMB presents for  Chief Complaint  Patient presents with   Medical Management of Chronic Issues    6 month follow up    1) obesity:  regain of 28 lbs since August since stopping GLP 1 agonist.  Not exercising due to knee pain and foot pain . Notes that since the weight gain he has been short of breath with yardwork ,  washing the cars,  etc but not with work or ADL's.  No chest pain   2) HTN;  Patient is taking his medications as prescribed  (losartan 50 mg , Amlodipine 10 mg ) and notes no adverse effects.  Home BP readings have been done about once per week and are  generally < 130/80 .      Outpatient Medications Prior to Visit  Medication Sig Dispense Refill   amLODipine (NORVASC) 10 MG tablet TAKE 1 TABLET BY MOUTH DAILY 90 tablet 3   Vitamin D, Ergocalciferol, (DRISDOL) 1.25 MG (50000 UNIT) CAPS capsule TAKE 1 CAPSULE BY MOUTH ONCE WEEKLY 12 capsule 1   ALPRAZolam (XANAX) 1 MG tablet TAKE 1/2 TABLET TWICE A DAY AS NEEDED FOR ANXIETY 30 tablet 2   cyclobenzaprine (FLEXERIL) 10 MG tablet TAKE 1 TABLET BY MOUTH 3 TIMES DAILY AS NEEDED. 30 tablet 2   gabapentin (NEURONTIN) 100 MG capsule Take one in the morning, & take two at bedtime. 270 capsule 3   losartan (COZAAR) 50 MG tablet TAKE 1 TABLET BY MOUTH AT BEDTIME 90 tablet 3   metFORMIN (GLUCOPHAGE-XR) 500 MG 24 hr tablet TAKE 1 TABLET BY MOUTH DAILY WITH BREAKFAST. 90 tablet 1   traMADol (ULTRAM) 50 MG tablet TAKE ONE TABLET BY MOUTH EVERY 6 HOURS AS NEEDED 90 tablet 2   zolpidem (AMBIEN) 10 MG tablet TAKE ONE TABLET AT  BEDTIME IF NEEDED FORSLEEP 30 tablet 2   diclofenac (VOLTAREN) 75 MG EC tablet TAKE 1 TABLET BY MOUTH TWICE DAILY (Patient not taking: Reported on 01/16/2023) 60 tablet 1   Facility-Administered Medications Prior to Visit  Medication Dose Route Frequency Provider Last Rate Last Admin   betamethasone acetate-betamethasone sodium phosphate (CELESTONE) injection 3 mg  3 mg Intra-articular Once Daylene Katayama M, DPM       diclofenac Sodium (VOLTAREN) 1 % topical gel 2 g  2 g Topical QID Edrick Kins, DPM        Review of Systems;  Patient denies headache, fevers, malaise, unintentional weight loss, skin rash, eye pain, sinus congestion and sinus pain, sore throat, dysphagia,  hemoptysis , cough, dyspnea, wheezing, chest pain, palpitations, orthopnea, edema, abdominal pain, nausea, melena, diarrhea, constipation, flank pain, dysuria, hematuria, urinary  Frequency, nocturia, numbness, tingling, seizures,  Focal weakness, Loss of consciousness,  Tremor, insomnia, depression, anxiety, and suicidal ideation.      Objective:  BP (!) 148/80   Pulse (!) 48   Temp 98 F (36.7 C) (Oral)   Ht 6' 3"$  (1.905 m)   Wt 286 lb 6.4 oz (129.9 kg)   SpO2 97%   BMI 35.80 kg/m   BP Readings from Last 3 Encounters:  01/16/23 (!) 148/80  07/11/22  126/64  04/03/22 132/78    Wt Readings from Last 3 Encounters:  01/16/23 286 lb 6.4 oz (129.9 kg)  07/11/22 258 lb (117 kg)  04/03/22 269 lb (122 kg)    Physical Exam Vitals reviewed.  Constitutional:      General: He is not in acute distress.    Appearance: Normal appearance. He is normal weight. He is not ill-appearing, toxic-appearing or diaphoretic.  HENT:     Head: Normocephalic.  Eyes:     General: No scleral icterus.       Right eye: No discharge.        Left eye: No discharge.     Conjunctiva/sclera: Conjunctivae normal.  Cardiovascular:     Rate and Rhythm: Normal rate. Rhythm irregular.     Heart sounds: Normal heart sounds.  Pulmonary:      Effort: Pulmonary effort is normal. No respiratory distress.     Breath sounds: Normal breath sounds.  Musculoskeletal:        General: Normal range of motion.     Cervical back: Normal range of motion.  Skin:    General: Skin is warm and dry.  Neurological:     General: No focal deficit present.     Mental Status: He is alert and oriented to person, place, and time. Mental status is at baseline.  Psychiatric:        Mood and Affect: Mood normal.        Behavior: Behavior normal.        Thought Content: Thought content normal.        Judgment: Judgment normal.     Lab Results  Component Value Date   HGBA1C 5.8 04/03/2022   HGBA1C 6.2 06/14/2021   HGBA1C 5.9 02/11/2020    Lab Results  Component Value Date   CREATININE 1.32 04/03/2022   CREATININE 1.26 12/18/2021   CREATININE 1.29 06/14/2021    Lab Results  Component Value Date   WBC 6.7 12/18/2021   HGB 15.3 12/18/2021   HCT 46.5 12/18/2021   PLT 197.0 12/18/2021   GLUCOSE 108 (H) 04/03/2022   CHOL 173 04/03/2022   TRIG 253.0 (H) 04/03/2022   HDL 38.90 (L) 04/03/2022   LDLDIRECT 101.0 04/03/2022   LDLCALC 112 (H) 06/14/2021   ALT 17 04/03/2022   AST 14 04/03/2022   NA 139 04/03/2022   K 4.0 04/03/2022   CL 104 04/03/2022   CREATININE 1.32 04/03/2022   BUN 14 04/03/2022   CO2 25 04/03/2022   TSH 1.07 02/11/2020   PSA 0.50 06/14/2021   HGBA1C 5.8 04/03/2022   MICROALBUR 1.3 04/03/2022    No results found.  Assessment & Plan:  .Essential hypertension Assessment & Plan: Not at goal .  Increasing losartan to 100 mg  , continue 10 mg amlodipine   Orders: -     Microalbumin / creatinine urine ratio  Prediabetes -     Hemoglobin A1c -     Comprehensive metabolic panel -     Lipid panel -     LDL cholesterol, direct  B12 deficiency Assessment & Plan: Managed with monthly injections since 2018.  Dose given today     Orders: -     Cyanocobalamin  Morbid obesity, unspecified obesity type  ([place]) Assessment & Plan: He lost 22 lbs using a GLP 1 agonist for appetite suppression. But has gained 28 lbs since the medication was stopped due to lack of coverage.  Has been taking metformin XR and Benefiber  .  Will increase dose of metformin to 1000 mg daily whilel awaiting PA for Zepbound .  Encouarged to find an exercise he can tolerate and start exercising 6  days per week    Cardiac arrhythmia, unspecified cardiac arrhythmia type -     EKG 12-Lead -     Magnesium -     LONG TERM MONITOR (3-14 DAYS); Future -     Ambulatory referral to Cardiology  Ventricular bigeminy Assessment & Plan: I have ordered and reviewed a 12 lead EKG and find that patient is in ventricular bigeminy.      Etiology unclear. Zio monitor ordered.  Checking lytes.  Orders: -     Ambulatory referral to Cardiology  Chronic pain of both knees Assessment & Plan: Managed with tylenol, tramadol and diclofenac.    Other orders -     Zepbound; Inject 2.5 mg into the skin once a week.  Dispense: 2 mL; Refill: 2 -     Losartan Potassium; Take 1 tablet (100 mg total) by mouth at bedtime.  Dispense: 90 tablet; Refill: 1 -     Diclofenac Sodium; Take 1 tablet (75 mg total) by mouth 2 (two) times daily.  Dispense: 60 tablet; Refill: 0 -     metFORMIN HCl ER (MOD); Take 1 tablet (1,000 mg total) by mouth daily with breakfast.  Dispense: 90 tablet; Refill: 1 -     Cyclobenzaprine HCl; Take 1 tablet (10 mg total) by mouth 3 (three) times daily as needed.  Dispense: 30 tablet; Refill: 2 -     ALPRAZolam; TAKE 1/2 TABLET TWICE A DAY AS NEEDED FOR ANXIETY  Dispense: 30 tablet; Refill: 5 -     Gabapentin; Take one in the morning, & take two at bedtime.  Dispense: 270 capsule; Refill: 3 -     traMADol HCl; Take 1 tablet (50 mg total) by mouth daily as needed.  Dispense: 30 tablet; Refill: 5 -     Zolpidem Tartrate; TAKE ONE TABLET AT BEDTIME IF NEEDED FORSLEEP  Dispense: 30 tablet; Refill: 5     I provided 40 minutes of  face-to-face time during this encounter reviewing patient's last visit with me, patient's  most recent visit with me,   previous  labs and imaging studies, counseling on currently addressed issues,  and post visit ordering to diagnostics and therapeutics .   Follow-up: Return in about 6 months (around 07/17/2023).   [name], MD

## 2023-01-16 NOTE — Assessment & Plan Note (Addendum)
I have ordered and reviewed a 12 lead EKG and find that patient is in ventricular bigeminy.      Etiology unclear. Zio monitor ordered.  Checking lytes.

## 2023-01-16 NOTE — Patient Instructions (Addendum)
I have ordered a ZIO Monitor for you to wear for 2 weeks to determine if the rhythm you are in today is transient or recurrent.   For your foot pain:  You can add up to 2000 mg of acetominophen (tylenol) every day safely  In divided doses ( 1000 mg every 12 hours.)   Add the diclofenac as needed, but avoid daily use .because it can cause gastritis     For the blood pressure:  Increase the losartan  to 100 mg daily for blood pressure, and continue the amlodipine.  Goal BP is 130/80 or less     Prior authorization for the Zepbound  [facility] by a different name!)  will need to be obtained  so while we are waiting  we can try increasing the dose of metformin   to 1000 mg daily   You need 30 minutes of exercise daily if you want to lose weight : Find an exercise machine you can use daily that doesn't stress knees and feet:  Rowing machine Recumbent bike

## 2023-01-16 NOTE — Assessment & Plan Note (Signed)
Managed with monthly injections since 2018.  Dose given today

## 2023-01-16 NOTE — Assessment & Plan Note (Signed)
Not at goal .  Increasing losartan to 100 mg  , continue 10 mg amlodipine

## 2023-01-16 NOTE — Assessment & Plan Note (Addendum)
He lost 22 lbs using a GLP 1 agonist for appetite suppression. But has gained 28 lbs since the medication was stopped due to lack of coverage.  Has been taking metformin XR and Benefiber  . Will increase dose of metformin to 1000 mg daily whilel awaiting PA for Zepbound .  Encouarged to find an exercise he can tolerate and start exercising 6  days per week

## 2023-01-16 NOTE — Assessment & Plan Note (Signed)
Managed with tylenol, tramadol and diclofenac.

## 2023-01-17 ENCOUNTER — Ambulatory Visit: Payer: Commercial Managed Care - PPO | Attending: Internal Medicine

## 2023-01-17 ENCOUNTER — Telehealth: Payer: Self-pay

## 2023-01-17 DIAGNOSIS — I499 Cardiac arrhythmia, unspecified: Secondary | ICD-10-CM

## 2023-01-17 LAB — LIPID PANEL
Cholesterol: 191 mg/dL (ref 0–200)
HDL: 42.5 mg/dL (ref >39.00)
NonHDL: 148.27
Total CHOL/HDL Ratio: 4
Triglycerides: 274 mg/dL — ABNORMAL HIGH (ref 0.0–149.0)
VLDL: 54.8 mg/dL — ABNORMAL HIGH (ref 0.0–40.0)

## 2023-01-17 LAB — COMPREHENSIVE METABOLIC PANEL
ALT: 15 U/L (ref 0–53)
AST: 18 U/L (ref 0–37)
Albumin: 4.8 g/dL (ref 3.5–5.2)
Alkaline Phosphatase: 46 U/L (ref 39–117)
BUN: 19 mg/dL (ref 6–23)
CO2: 28 mEq/L (ref 19–32)
Calcium: 10.1 mg/dL (ref 8.4–10.5)
Chloride: 103 mEq/L (ref 96–112)
Creatinine, Ser: 1.26 mg/dL (ref 0.40–1.50)
GFR: 62.65 mL/min (ref >60.00)
Glucose, Bld: 86 mg/dL (ref 70–99)
Potassium: 4.2 mEq/L (ref 3.5–5.1)
Sodium: 139 mEq/L (ref 135–145)
Total Bilirubin: 0.6 mg/dL (ref 0.2–1.2)
Total Protein: 7.2 g/dL (ref 6.0–8.3)

## 2023-01-17 LAB — MICROALBUMIN / CREATININE URINE RATIO
Creatinine,U: 220.7 mg/dL
Microalb Creat Ratio: 1.2 mg/g (ref 0.0–30.0)
Microalb, Ur: 2.7 mg/dL — ABNORMAL HIGH (ref 0.0–1.9)

## 2023-01-17 LAB — MAGNESIUM: Magnesium: 1.9 mg/dL (ref 1.5–2.5)

## 2023-01-17 LAB — LDL CHOLESTEROL, DIRECT: Direct LDL: 111 mg/dL

## 2023-01-17 LAB — HEMOGLOBIN A1C: Hgb A1c MFr Bld: 6.2 % (ref 4.6–6.5)

## 2023-01-17 MED ORDER — ROSUVASTATIN CALCIUM 10 MG PO TABS: 10.0000 mg | ORAL_TABLET | Freq: Every day | ORAL | 0 refills | Status: DC

## 2023-01-17 NOTE — Addendum Note (Signed)
Addended by: [name] on: 01/17/2023 03:54 PM   Modules accepted: Orders

## 2023-01-17 NOTE — Telephone Encounter (Signed)
-----   Message from [name], MD sent at 01/17/2023  1:46 PM EST ----- Regarding: Zepbound Can you Please start PA for Zepbound,  patient did extremely well with Mounjaro,  lost 30 lbs ,  A1c dropped from 6.2 to 5.8  but has increased to 6.2 along with 28 lb weight gain since stopping medication .  Prediabetes   Thank you

## 2023-01-18 ENCOUNTER — Telehealth: Payer: Self-pay

## 2023-01-18 ENCOUNTER — Other Ambulatory Visit (HOSPITAL_COMMUNITY): Payer: Self-pay

## 2023-01-18 NOTE — Telephone Encounter (Signed)
LM FOR PT TO CB RE ::   [name], MD  Adair Laundry, CMA I will send medication to pharmacy.  Pleas ask him to return in 3 weeks for liver enzymes.  No fasting is required

## 2023-01-18 NOTE — Telephone Encounter (Signed)
error 

## 2023-01-18 NOTE — Telephone Encounter (Signed)
Pharmacy Patient Advocate Encounter   Received notification that prior authorization for Zepbound 2.'5MG'$ /0.5ML pen-injectors is required/requested.  Per Test Claim: Product/service not covered - plan/benefit exclusion. New drugs to market, excluded. Non-formulary drug   PA submitted on 01/18/23 to (ins) [employer] via CoverMyMeds Key [id number] Status is pending

## 2023-01-21 ENCOUNTER — Telehealth: Payer: Self-pay

## 2023-01-21 NOTE — Telephone Encounter (Signed)
Received a fax regarding Prior Authorization from [facility] for Zepbound 2.'5mg'$ /0.87m.  Authorization has been DENIED because this medication is not covered by the plan. Medications used for weight loss are not covered under your health plan's pharmacy coverage.   Phone# [phone] Case number: [id number]

## 2023-01-21 NOTE — Telephone Encounter (Signed)
error 

## 2023-01-21 NOTE — Telephone Encounter (Signed)
Pharmacy Patient Advocate Encounter  Received notification from [employer] that the request for prior authorization for Zepbound 2.'5MG'$ /0.5ML pen-injectors has been denied due to Plan exclusion.    Please be advised we currently do not have a Pharmacist to review denials, therefore you will need to process appeals accordingly as needed. Thanks for your support at this time.  E-appeal available (Key: [id number])

## 2023-01-22 DIAGNOSIS — I499 Cardiac arrhythmia, unspecified: Secondary | ICD-10-CM

## 2023-01-23 NOTE — Telephone Encounter (Signed)
   Appeal for coverage - Zepbound - denied Plan exclusion

## 2023-01-23 NOTE — Telephone Encounter (Signed)
Bryan Galloway (Key: [id number])  Your appeal has been sent to [employer].

## 2023-01-30 ENCOUNTER — Telehealth: Payer: Self-pay | Admitting: Internal Medicine

## 2023-01-30 NOTE — Telephone Encounter (Signed)
Spoke with pt to let him know that per his AVS he is to increase the losartan to 100 mg daily and continue the Amlodipine 10 mg daily. Pt gave a verbal understanding.

## 2023-01-30 NOTE — Telephone Encounter (Signed)
Patient is asking for a call back to let him know if he needs to take both the increased dose of losartan (COZAAR) 100 MG tablet and the amLODipine (NORVASC) 10 MG tablet . Please call back at [phone]

## 2023-02-08 ENCOUNTER — Other Ambulatory Visit: Payer: Commercial Managed Care - PPO

## 2023-02-14 ENCOUNTER — Other Ambulatory Visit: Payer: Self-pay | Admitting: Internal Medicine

## 2023-02-14 ENCOUNTER — Encounter: Payer: Self-pay | Admitting: Internal Medicine

## 2023-02-14 ENCOUNTER — Ambulatory Visit: Payer: Commercial Managed Care - PPO

## 2023-02-14 DIAGNOSIS — I498 Other specified cardiac arrhythmias: Secondary | ICD-10-CM

## 2023-02-14 DIAGNOSIS — T466X5A Adverse effect of antihyperlipidemic and antiarteriosclerotic drugs, initial encounter: Secondary | ICD-10-CM | POA: Insufficient documentation

## 2023-02-14 MED ORDER — METOPROLOL SUCCINATE ER 25 MG PO TB24: 25.0000 mg | ORAL_TABLET | Freq: Every day | ORAL | 3 refills | Status: DC

## 2023-02-14 NOTE — Progress Notes (Signed)
Thanks. He has an appt in early May with Agbor Etang.  I'll try him on a low dose of metoprolol

## 2023-02-14 NOTE — Assessment & Plan Note (Signed)
High PVC burden on 14 day ZIO monitor.  Trial of low dose metoprolol.  Has appt in early May with cardiology

## 2023-02-15 ENCOUNTER — Telehealth: Payer: Self-pay | Admitting: *Deleted

## 2023-02-15 NOTE — Telephone Encounter (Signed)
Please place future orders for lab appt.  

## 2023-02-19 ENCOUNTER — Other Ambulatory Visit (INDEPENDENT_AMBULATORY_CARE_PROVIDER_SITE_OTHER): Payer: Commercial Managed Care - PPO

## 2023-02-19 ENCOUNTER — Telehealth: Payer: Self-pay

## 2023-02-19 ENCOUNTER — Ambulatory Visit (INDEPENDENT_AMBULATORY_CARE_PROVIDER_SITE_OTHER): Payer: Commercial Managed Care - PPO

## 2023-02-19 DIAGNOSIS — I10 Essential (primary) hypertension: Secondary | ICD-10-CM

## 2023-02-19 DIAGNOSIS — E538 Deficiency of other specified B group vitamins: Secondary | ICD-10-CM

## 2023-02-19 DIAGNOSIS — E781 Pure hyperglyceridemia: Secondary | ICD-10-CM

## 2023-02-19 MED ORDER — EZETIMIBE 10 MG PO TABS: 10.0000 mg | ORAL_TABLET | Freq: Every day | ORAL | 5 refills | Status: DC

## 2023-02-19 MED ORDER — CYANOCOBALAMIN 1000 MCG/ML IJ SOLN
1000.0000 ug | Freq: Once | INTRAMUSCULAR | Status: AC
  Administered 2023-02-19: 1000 ug via INTRAMUSCULAR

## 2023-02-19 NOTE — Addendum Note (Signed)
Addended by: [name] on: 02/19/2023 06:43 PM   Modules accepted: Orders

## 2023-02-19 NOTE — Telephone Encounter (Signed)
Orders have been placed for lab appt.

## 2023-02-19 NOTE — Telephone Encounter (Signed)
He may may tolerate generic Zetia better than the statin.    It works by inhibiting the absorption of cholesterol by the small intestine, so it lowers LDL .   I [facility] to [facility]

## 2023-02-19 NOTE — Telephone Encounter (Signed)
Pt came in to the office today for b12 injection and blood work. Pt stated that he is unable to take the statin due to joint pain. Pt is wanting to know if something else can be called in for his cholesterol.

## 2023-02-19 NOTE — Progress Notes (Signed)
presents today for injection per MD orders. B12 injection administered IM in left Upper Arm. Administration without incident. Patient tolerated well.  Yerachmiel Spinney,cma   

## 2023-02-20 ENCOUNTER — Other Ambulatory Visit: Payer: Self-pay | Admitting: Internal Medicine

## 2023-02-20 LAB — HEPATIC FUNCTION PANEL
ALT: 15 U/L (ref 0–53)
AST: 14 U/L (ref 0–37)
Albumin: 4.8 g/dL (ref 3.5–5.2)
Alkaline Phosphatase: 48 U/L (ref 39–117)
Bilirubin, Direct: 0.1 mg/dL (ref 0.0–0.3)
Total Bilirubin: 0.5 mg/dL (ref 0.2–1.2)
Total Protein: 6.9 g/dL (ref 6.0–8.3)

## 2023-02-20 LAB — BASIC METABOLIC PANEL
BUN: 18 mg/dL (ref 6–23)
CO2: 27 mEq/L (ref 19–32)
Calcium: 10 mg/dL (ref 8.4–10.5)
Chloride: 104 mEq/L (ref 96–112)
Creatinine, Ser: 1.29 mg/dL (ref 0.40–1.50)
GFR: 60.87 mL/min (ref >60.00)
Glucose, Bld: 99 mg/dL (ref 70–99)
Potassium: 4 mEq/L (ref 3.5–5.1)
Sodium: 140 mEq/L (ref 135–145)

## 2023-02-20 NOTE — Telephone Encounter (Signed)
Pt is no longer taking the Crestor. Is it okay to refuse?

## 2023-02-20 NOTE — Telephone Encounter (Signed)
Pt is aware and gave a verbal understanding.  

## 2023-03-06 ENCOUNTER — Telehealth: Payer: Self-pay | Admitting: Internal Medicine

## 2023-03-06 NOTE — Telephone Encounter (Signed)
Pt called wanting to speak to candice and I let him know there was no Candice that works here. Pt stated he had a bill that was in the red of a copay that he did not pay. Pt stated he called the billing department and they stated it was initiated in [place] and it would not go into collections if it was under 45 dollars. Pt stated that he paid his copay and have a receipt for it. I told the pt that we dont have billing in the office

## 2023-03-06 NOTE — Telephone Encounter (Addendum)
I called patient verified identity read back of name date of birth, advised patient my name and title and I wanted to see if I could help with  the said bill patient had called about or give a better understanding. Patient advised me the intent was to bring bill by office that " I always pay my co-pay at every visit in cash and this happen 2 years ago I was advised to call billing and I stayed on the phone for 1.5 hours". At that I confirmed if we talking about a bill two years old he said no he was talking about a bill he just received for visit 01/16/23 ." " I will bring it by and you can handle it". I tried to explain if correction was needed would have to go to billing, But I was glad to take  a look and see what I could find out. But if corrections were needed he would have to contact billing. I also gave office fax to patient and advised the bill could be faxed to the office patient stated okay I will fax. Patient gave an account number on the bill [id number]. Patient raising voice several times and I reminded I was trying to help.Awaiting bill.

## 2023-03-06 NOTE — Telephone Encounter (Signed)
[name] came to inform me that the patient was on the phone inquiring about a year old 25.00 bill. The call was transferred to me . I asked if it was [name] , He stated yes. I asked how could I help him . He stated that he received a bill and the bill was for 25.00 and that he should not have a bill ,because he always pays his copay. I stated that I could not do anything about his bill ,because we do not have billing in this department. He stated that he called billing and they told him it originated in [place]. I stated I understand that however, billing should be able to help you. He stated I'm going to repeat what I just said that it originated in [place] and he stated he did not receive his care in [place] so he expects someone to handle it in [place]. While he is saying these things he is yelling at me over the phone. He also stated that he is going to bring the bill by this office and some there should take care of it. He also stated he will let Dr. Darrick Huntsman know. I stated he can bring the bill and inform her also.Conversation ended.

## 2023-03-06 NOTE — Telephone Encounter (Signed)
Received the bills and called patient back and advised that the 25.00 was his part after insurance for his B 12 injection , that the insurance did not pay the entire amount for the B12. Patient stated that: if I owed it I am glad to pay but did not understand that new insurance was not paying the entire bill.

## 2023-03-20 IMAGING — DX DG ABDOMEN 1V
2 series · 2 of 2 positions shown · non-contrast
Comparison: None.

CLINICAL DATA: Nephrolithiasis. Patient passed a stone about 2
months ago and currently has no pain or symptoms.

EXAM:
ABDOMEN - 1 VIEW

[abdomen standing ap (1 of 2)]
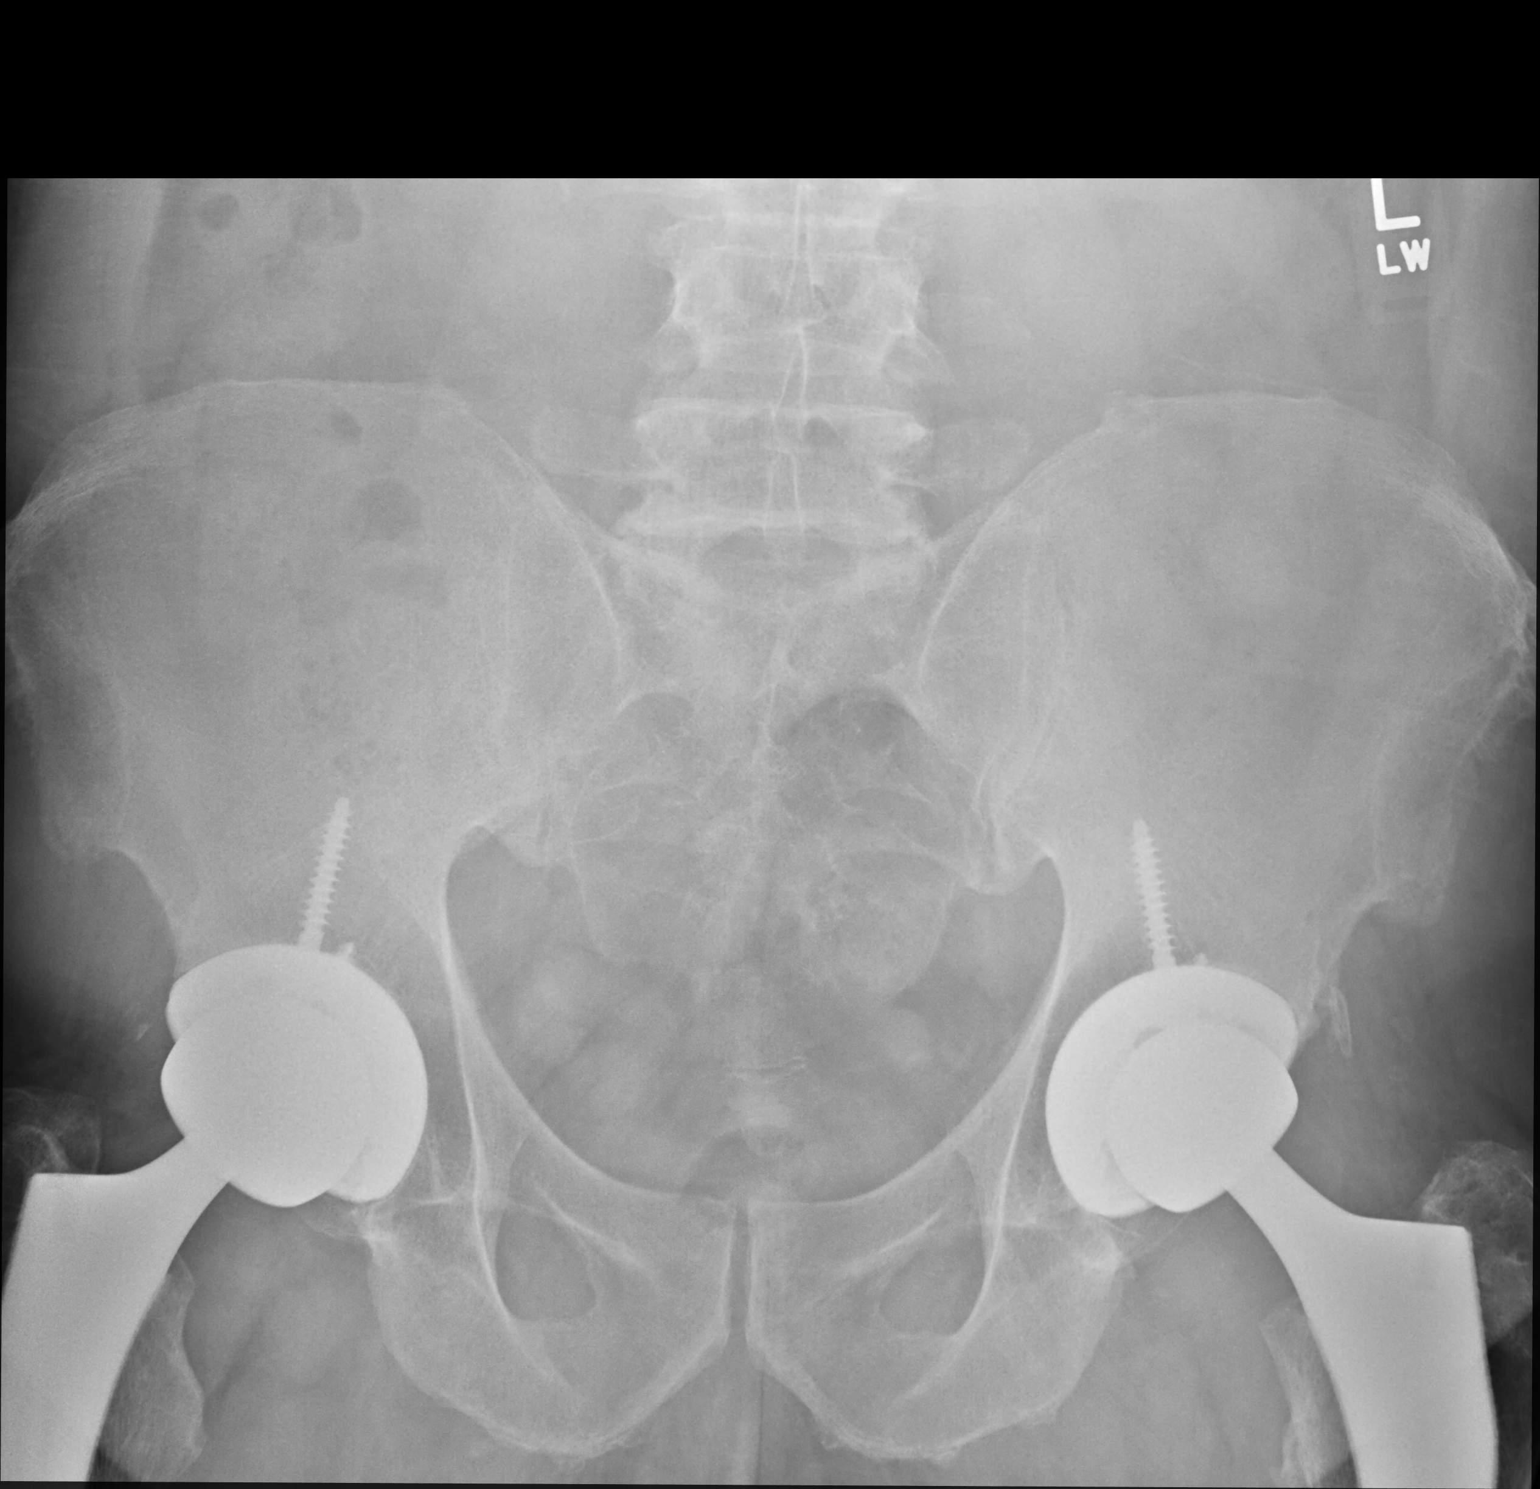

[abdomen standing ap (2 of 2)]
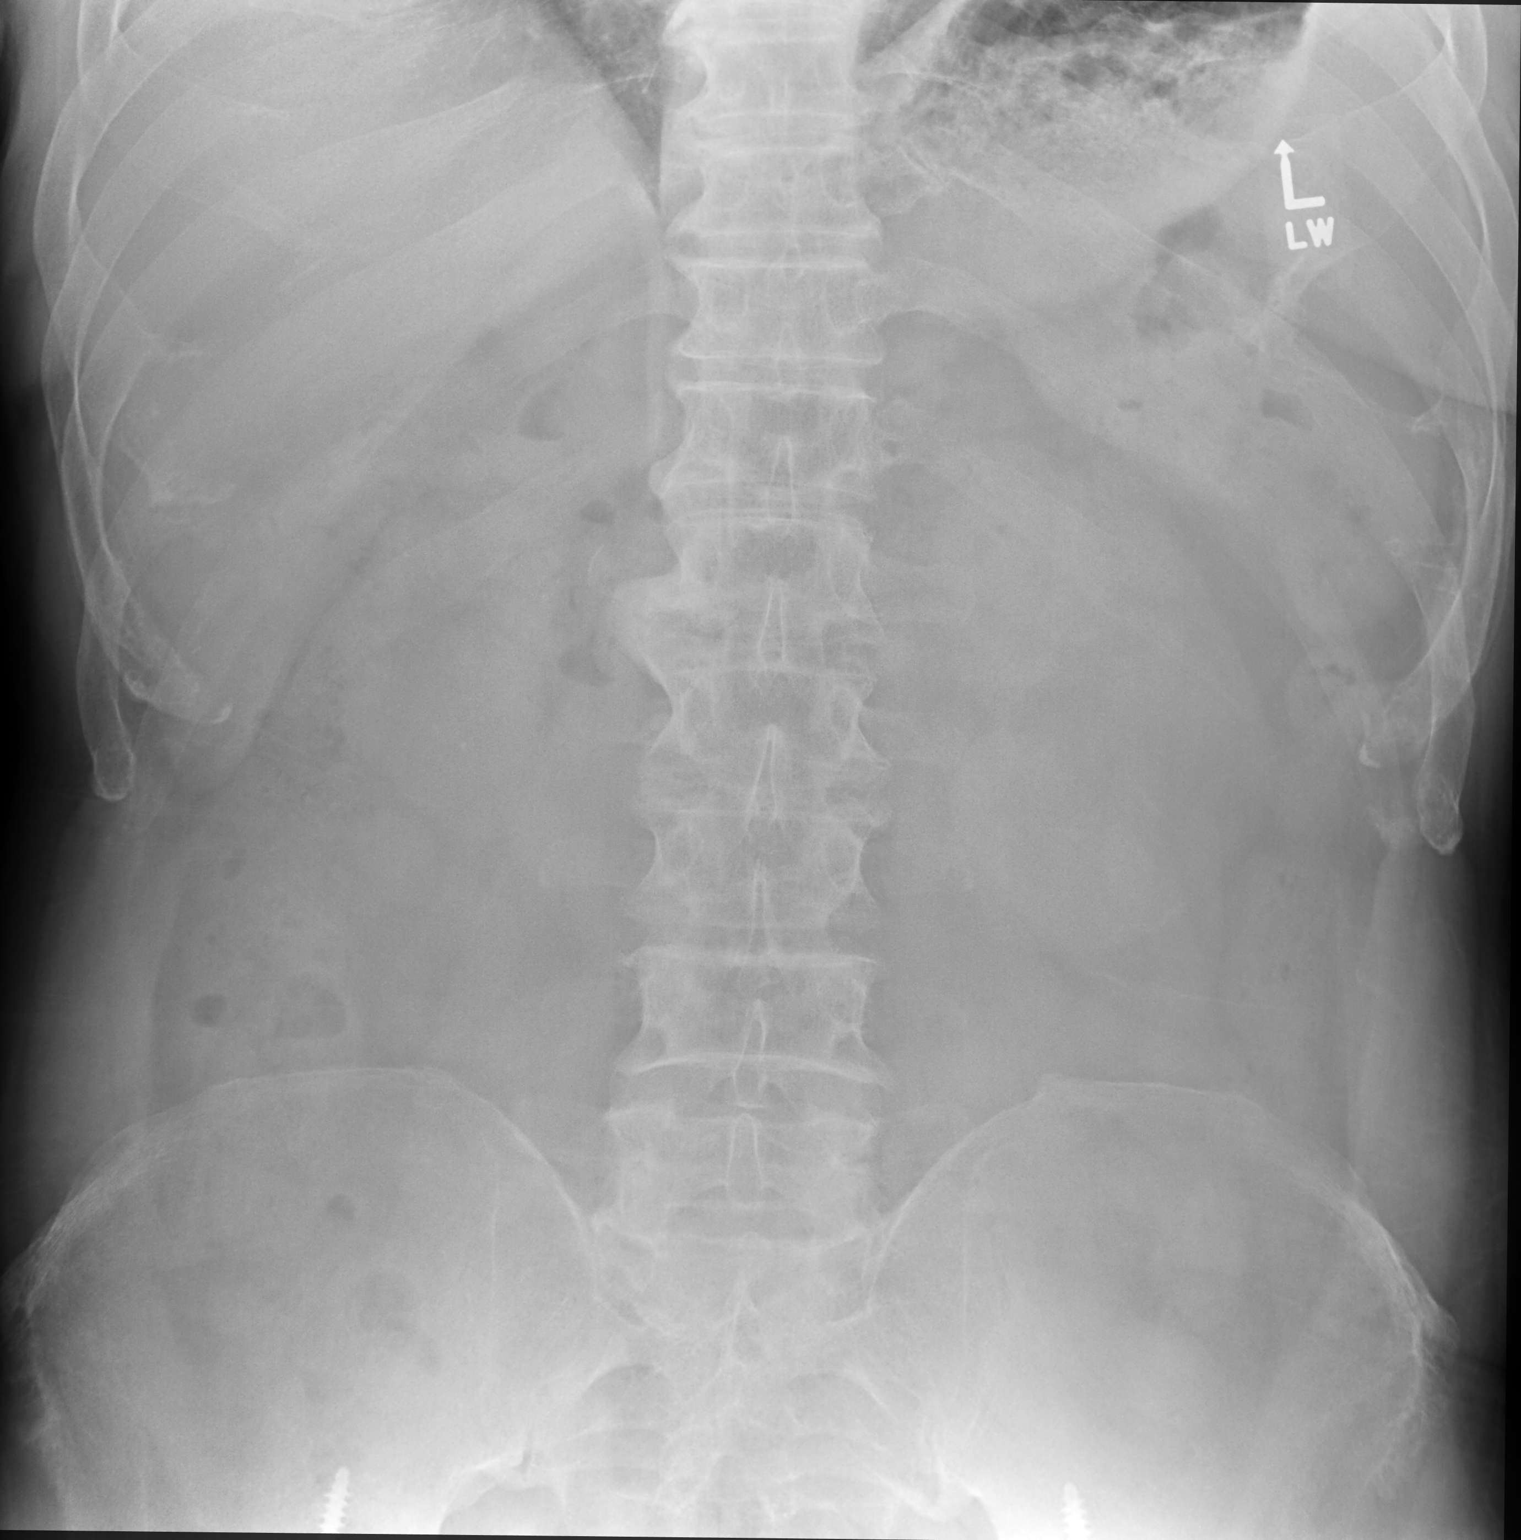

[2 of 2 positions shown; findings below may reference images not displayed]

FINDINGS: The bowel gas pattern is normal. A 2 mm ill-defined soft tissue
calcification is seen projecting over the lower pole of the right
kidney. An additional ill-defined 3 mm soft tissue calcification is
seen within the pelvis on the left. Intact bilateral hip
replacements are noted.
IMPRESSION: 1. Probable 2 mm renal calculus projecting over the lower pole of
the right kidney.
2. 3 mm soft tissue calcification within the pelvis which may be
vascular in origin.

## 2023-03-25 ENCOUNTER — Ambulatory Visit (INDEPENDENT_AMBULATORY_CARE_PROVIDER_SITE_OTHER): Payer: Commercial Managed Care - PPO

## 2023-03-25 DIAGNOSIS — E538 Deficiency of other specified B group vitamins: Secondary | ICD-10-CM | POA: Diagnosis not present

## 2023-03-25 MED ORDER — CYANOCOBALAMIN 1000 MCG/ML IJ SOLN
1000.0000 ug | Freq: Once | INTRAMUSCULAR | Status: AC
  Administered 2023-03-25: 1000 ug via INTRAMUSCULAR

## 2023-03-25 NOTE — Progress Notes (Signed)
Patient arrived for her B12 injection and it was administered into his right deltoid. Patient tolerated the injection well and did not show any signs of distress or voice any concerns.   Patient states Insurance says [facility] charges too much to give these injections so Patient would like to start doing them at home himself.

## 2023-04-08 ENCOUNTER — Ambulatory Visit: Payer: Commercial Managed Care - PPO | Admitting: Cardiology

## 2023-04-25 ENCOUNTER — Ambulatory Visit: Payer: Commercial Managed Care - PPO

## 2023-05-16 ENCOUNTER — Other Ambulatory Visit: Payer: Self-pay | Admitting: Internal Medicine

## 2023-06-10 ENCOUNTER — Other Ambulatory Visit: Payer: Self-pay | Admitting: Internal Medicine

## 2023-06-10 ENCOUNTER — Telehealth: Payer: Self-pay | Admitting: Internal Medicine

## 2023-06-10 NOTE — Telephone Encounter (Signed)
Refilled: 02/20/2023 Last OV: 01/16/2023 Next OV: 07/19/2023

## 2023-06-10 NOTE — Telephone Encounter (Signed)
Vitamin D level was normal in 2021.  He does not need a mega dose. Refill denied

## 2023-06-11 NOTE — Telephone Encounter (Signed)
noted 

## 2023-07-19 ENCOUNTER — Ambulatory Visit: Payer: Commercial Managed Care - PPO | Admitting: Internal Medicine

## 2023-07-19 ENCOUNTER — Encounter: Payer: Self-pay | Admitting: Internal Medicine

## 2023-07-19 VITALS — BP 126/74 | HR 75 | Temp 97.8°F | Ht 75.0 in | Wt 287.0 lb

## 2023-07-19 DIAGNOSIS — E611 Iron deficiency: Secondary | ICD-10-CM | POA: Diagnosis not present

## 2023-07-19 DIAGNOSIS — R7303 Prediabetes: Secondary | ICD-10-CM

## 2023-07-19 DIAGNOSIS — F5101 Primary insomnia: Secondary | ICD-10-CM

## 2023-07-19 DIAGNOSIS — E538 Deficiency of other specified B group vitamins: Secondary | ICD-10-CM

## 2023-07-19 DIAGNOSIS — E781 Pure hyperglyceridemia: Secondary | ICD-10-CM | POA: Diagnosis not present

## 2023-07-19 DIAGNOSIS — I10 Essential (primary) hypertension: Secondary | ICD-10-CM

## 2023-07-19 MED ORDER — ZOLPIDEM TARTRATE 10 MG PO TABS: ORAL_TABLET | ORAL | 5 refills | Status: DC

## 2023-07-19 MED ORDER — CYANOCOBALAMIN 1000 MCG/ML IJ SOLN: 1000.0000 ug | INTRAMUSCULAR | 2 refills | Status: DC

## 2023-07-19 MED ORDER — ALPRAZOLAM 1 MG PO TABS: ORAL_TABLET | ORAL | 5 refills | Status: DC

## 2023-07-19 NOTE — Patient Instructions (Addendum)
You can self administer b12 injections  ,  or have  [facility] do it.   Continue metoprolol to control the PVC's.      There are several ways to get zepbound cheaper through a weight loss clinic or a compounding pharmacy   [facility] MD  [facility] in [place]

## 2023-07-19 NOTE — Progress Notes (Unsigned)
Subjective:  Patient ID: Bryan Galloway, male    DOB: 1966-05-27  Age: 57 y.o. MRN: [id number]  CC: The primary encounter diagnosis was Essential hypertension. Diagnoses of Hypertriglyceridemia, Prediabetes, Iron deficiency, and Morbid obesity, unspecified obesity type (HCC) were also pertinent to this visit.   HPI FLORENCE MUSIC presents for  Chief Complaint  Patient presents with   Medical Management of Chronic Issues   1) HTN:  taknng amlodipine  losartan , and recently metoprolol added for high burden of PVCs on recent ZIO monitor   2) Insomnia:  taking ambien  at bedtime.  Using  alprazolam prn.  Last refill on both was in February.   3) obesity:  he has been uable to lose weight since stopping Mounjaro.  Metformin tolerated but no weight chagne.  Not exercising due to the heat, but doing yard work.  Not taking zepbound due to insurance noncoverage even with attempt to get PA.   4) B12 deficiency:  has been taking sublingual b12 for the last 2 months due to [facility] billing him for injections and giving him "past due " notices 10 days after a visit. Discussed getting njections done by [facility] or by Robin at work  alternative   Outpatient Medications Prior to Visit  Medication Sig Dispense Refill   ALPRAZolam (XANAX) 1 MG tablet TAKE 1/2 TABLET TWICE A DAY AS NEEDED FOR ANXIETY 30 tablet 5   amLODipine (NORVASC) 10 MG tablet TAKE 1 TABLET BY MOUTH DAILY 90 tablet 3   CYANOCOBALAMIN PO Take by mouth.     cyclobenzaprine (FLEXERIL) 10 MG tablet Take 1 tablet (10 mg total) by mouth 3 (three) times daily as needed. 30 tablet 2   diclofenac (VOLTAREN) 75 MG EC tablet Take 1 tablet (75 mg total) by mouth 2 (two) times daily. 60 tablet 0   ezetimibe (ZETIA) 10 MG tablet TAKE ONE TABLET (10 MG) BY MOUTH EVERY DAY 90 tablet 3   gabapentin (NEURONTIN) 100 MG capsule Take one in the morning, & take two at bedtime. 270 capsule 3   losartan (COZAAR) 100 MG tablet TAKE 1 TABLET BY MOUTH  AT BEDTIME 90 tablet 1   metFORMIN (GLUCOPHAGE-XR) 500 MG 24 hr tablet TAKE 2 TABLETS BY MOUTH DAILY WITH BREAKFAST 180 tablet 1   metoprolol succinate (TOPROL-XL) 25 MG 24 hr tablet Take 1 tablet (25 mg total) by mouth daily. 90 tablet 3   rosuvastatin (CRESTOR) 10 MG tablet Take 1 tablet (10 mg total) by mouth daily. 30 tablet 0   traMADol (ULTRAM) 50 MG tablet Take 1 tablet (50 mg total) by mouth daily as needed. 30 tablet 5   Vitamin D, Ergocalciferol, 50000 units CAPS TAKE 1 CAPSULE BY MOUTH ONCE WEEKLY 12 capsule 1   zolpidem (AMBIEN) 10 MG tablet TAKE ONE TABLET AT BEDTIME IF NEEDED FORSLEEP 30 tablet 5   tirzepatide (ZEPBOUND) 2.5 MG/0.5ML Pen Inject 2.5 mg into the skin once a week. 2 mL 2   Facility-Administered Medications Prior to Visit  Medication Dose Route Frequency Provider Last Rate Last Admin   betamethasone acetate-betamethasone sodium phosphate (CELESTONE) injection 3 mg  3 mg Intra-articular Once Felecia Shelling, DPM        Review of Systems;  Patient denies headache, fevers, malaise, unintentional weight loss, skin rash, eye pain, sinus congestion and sinus pain, sore throat, dysphagia,  hemoptysis , cough, dyspnea, wheezing, chest pain, palpitations, orthopnea, edema, abdominal pain, nausea, melena, diarrhea, constipation, flank pain, dysuria, hematuria, urinary  Frequency, nocturia, numbness, tingling, seizures,  Focal weakness, Loss of consciousness,  Tremor, insomnia, depression, anxiety, and suicidal ideation.      Objective:  BP 126/74   Pulse 75   Temp 97.8 F (36.6 C) (Oral)   Ht 6\' 3"  (1.905 m)   Wt 287 lb (130.2 kg)   SpO2 98%   BMI 35.87 kg/m   BP Readings from Last 3 Encounters:  07/19/23 126/74  01/16/23 (!) 148/80  07/11/22 126/64    Wt Readings from Last 3 Encounters:  07/19/23 287 lb (130.2 kg)  01/16/23 286 lb 6.4 oz (129.9 kg)  07/11/22 258 lb (117 kg)    Physical Exam  Lab Results  Component Value Date   HGBA1C 6.2 01/16/2023    HGBA1C 5.8 04/03/2022   HGBA1C 6.2 06/14/2021    Lab Results  Component Value Date   CREATININE 1.29 02/19/2023   CREATININE 1.26 01/16/2023   CREATININE 1.32 04/03/2022    Lab Results  Component Value Date   WBC 6.7 12/18/2021   HGB 15.3 12/18/2021   HCT 46.5 12/18/2021   PLT 197.0 12/18/2021   GLUCOSE 99 02/19/2023   CHOL 191 01/16/2023   TRIG 274.0 (H) 01/16/2023   HDL 42.50 01/16/2023   LDLDIRECT 111.0 01/16/2023   LDLCALC 112 (H) 06/14/2021   ALT 15 02/19/2023   AST 14 02/19/2023   NA 140 02/19/2023   K 4.0 02/19/2023   CL 104 02/19/2023   CREATININE 1.29 02/19/2023   BUN 18 02/19/2023   CO2 27 02/19/2023   TSH 1.07 02/11/2020   PSA 0.50 06/14/2021   HGBA1C 6.2 01/16/2023   MICROALBUR 2.7 (H) 01/16/2023    No results found.  Assessment & Plan:  .Essential hypertension  Hypertriglyceridemia  Prediabetes  Iron deficiency  Morbid obesity, unspecified obesity type (HCC)     I provided 30 minutes of face-to-face time during this encounter reviewing patient's last visit with me, patient's  most recent visit with cardiology,  nephrology,  and neurology,  recent surgical and non surgical procedures, previous  labs and imaging studies, counseling on currently addressed issues,  and post visit ordering to diagnostics and therapeutics .   Follow-up: No follow-ups on file.   Sherlene Shams, MD

## 2023-07-19 NOTE — Assessment & Plan Note (Addendum)
He lost 22 lbs using a GLP 1 agonist for appetite suppression. But has gained 28 lbs since the medication was stopped due to lack of coverage.  Has been taking metformin XR t 1000 mg dose without change ,  and Zepbound was not covered by insurance and PA was denied.  He would like to consider a weight loss clinic [facility]) or [facility] to compound the medication.

## 2023-07-20 NOTE — Assessment & Plan Note (Addendum)
Managed with ambien 10 mg daily... HE TYPICALLY USES 1/2 TABLET

## 2023-07-21 LAB — COMPREHENSIVE METABOLIC PANEL
AG Ratio: 1.9 (calc) (ref 1.0–2.5)
ALT: 14 U/L (ref 9–46)
AST: 12 U/L (ref 10–35)
Albumin: 4.4 g/dL (ref 3.6–5.1)
Alkaline phosphatase (APISO): 56 U/L (ref 35–144)
BUN: 16 mg/dL (ref 7–25)
CO2: 22 mmol/L (ref 20–32)
Calcium: 9.7 mg/dL (ref 8.6–10.3)
Chloride: 106 mmol/L (ref 98–110)
Creat: 1.25 mg/dL (ref 0.70–1.30)
Globulin: 2.3 g/dL (ref 1.9–3.7)
Glucose, Bld: 98 mg/dL (ref 65–99)
Potassium: 4.5 mmol/L (ref 3.5–5.3)
Sodium: 140 mmol/L (ref 135–146)
Total Bilirubin: 0.6 mg/dL (ref 0.2–1.2)
Total Protein: 6.7 g/dL (ref 6.1–8.1)

## 2023-07-21 LAB — B12 AND FOLATE PANEL
Folate: 7.1 ng/mL
Vitamin B-12: 1405 pg/mL — ABNORMAL HIGH (ref 200–1100)

## 2023-07-21 LAB — CBC WITH DIFFERENTIAL/PLATELET
Absolute Monocytes: 456 {cells}/uL (ref 200–950)
Basophils Absolute: 60 {cells}/uL (ref 0–200)
Basophils Relative: 0.9 %
Eosinophils Absolute: 101 {cells}/uL (ref 15–500)
Eosinophils Relative: 1.5 %
HCT: 44.9 % (ref 38.5–50.0)
Hemoglobin: 15.1 g/dL (ref 13.2–17.1)
Lymphs Abs: 2064 {cells}/uL (ref 850–3900)
MCH: 29.1 pg (ref 27.0–33.0)
MCHC: 33.6 g/dL (ref 32.0–36.0)
MCV: 86.5 fL (ref 80.0–100.0)
MPV: 10.2 fL (ref 7.5–12.5)
Monocytes Relative: 6.8 %
Neutro Abs: 4020 {cells}/uL (ref 1500–7800)
Neutrophils Relative %: 60 %
Platelets: 192 10*3/uL (ref 140–400)
RBC: 5.19 10*6/uL (ref 4.20–5.80)
RDW: 13.2 % (ref 11.0–15.0)
Total Lymphocyte: 30.8 %
WBC: 6.7 10*3/uL (ref 3.8–10.8)

## 2023-07-21 LAB — TSH: TSH: 1.74 m[IU]/L (ref 0.40–4.50)

## 2023-07-21 LAB — HEMOGLOBIN A1C
Hgb A1c MFr Bld: 6.1 %{Hb} — ABNORMAL HIGH (ref <5.7)
Mean Plasma Glucose: 128 mg/dL
eAG (mmol/L): 7.1 mmol/L

## 2023-07-21 LAB — LIPID PANEL
Cholesterol: 153 mg/dL (ref <200)
HDL: 43 mg/dL (ref >=40)
LDL Cholesterol (Calc): 85 mg/dL
Non-HDL Cholesterol (Calc): 110 mg/dL (ref <130)
Total CHOL/HDL Ratio: 3.6 (calc) (ref <5.0)
Triglycerides: 155 mg/dL — ABNORMAL HIGH (ref <150)

## 2023-07-21 LAB — LDL CHOLESTEROL, DIRECT: Direct LDL: 100 mg/dL — ABNORMAL HIGH (ref <100)

## 2023-07-23 ENCOUNTER — Other Ambulatory Visit: Payer: Self-pay | Admitting: Internal Medicine

## 2023-11-08 ENCOUNTER — Other Ambulatory Visit: Payer: Self-pay | Admitting: Internal Medicine

## 2024-01-08 ENCOUNTER — Other Ambulatory Visit: Payer: Self-pay | Admitting: Internal Medicine

## 2024-01-08 DIAGNOSIS — I10 Essential (primary) hypertension: Secondary | ICD-10-CM

## 2024-01-08 DIAGNOSIS — R7303 Prediabetes: Secondary | ICD-10-CM

## 2024-01-08 DIAGNOSIS — E559 Vitamin D deficiency, unspecified: Secondary | ICD-10-CM

## 2024-01-08 NOTE — Telephone Encounter (Signed)
Refilled: 02/20/2023 Last OV: 07/19/2023 Next OV: 01/20/2024

## 2024-01-08 NOTE — Telephone Encounter (Signed)
Labs needed ASAP

## 2024-01-20 ENCOUNTER — Encounter: Payer: Self-pay | Admitting: Internal Medicine

## 2024-01-20 ENCOUNTER — Ambulatory Visit: Payer: Commercial Managed Care - PPO | Admitting: Internal Medicine

## 2024-01-20 VITALS — BP 128/76 | HR 100 | Temp 98.6°F | Wt 300.0 lb

## 2024-01-20 DIAGNOSIS — I10 Essential (primary) hypertension: Secondary | ICD-10-CM | POA: Diagnosis not present

## 2024-01-20 DIAGNOSIS — G8929 Other chronic pain: Secondary | ICD-10-CM

## 2024-01-20 DIAGNOSIS — R944 Abnormal results of kidney function studies: Secondary | ICD-10-CM

## 2024-01-20 DIAGNOSIS — M25561 Pain in right knee: Secondary | ICD-10-CM | POA: Diagnosis not present

## 2024-01-20 DIAGNOSIS — Z6837 Body mass index (BMI) 37.0-37.9, adult: Secondary | ICD-10-CM

## 2024-01-20 DIAGNOSIS — E782 Mixed hyperlipidemia: Secondary | ICD-10-CM

## 2024-01-20 DIAGNOSIS — M25562 Pain in left knee: Secondary | ICD-10-CM

## 2024-01-20 DIAGNOSIS — Z0000 Encounter for general adult medical examination without abnormal findings: Secondary | ICD-10-CM | POA: Diagnosis not present

## 2024-01-20 DIAGNOSIS — D126 Benign neoplasm of colon, unspecified: Secondary | ICD-10-CM

## 2024-01-20 DIAGNOSIS — I129 Hypertensive chronic kidney disease with stage 1 through stage 4 chronic kidney disease, or unspecified chronic kidney disease: Secondary | ICD-10-CM

## 2024-01-20 DIAGNOSIS — E66812 Obesity, class 2: Secondary | ICD-10-CM | POA: Diagnosis not present

## 2024-01-20 DIAGNOSIS — Z125 Encounter for screening for malignant neoplasm of prostate: Secondary | ICD-10-CM | POA: Diagnosis not present

## 2024-01-20 MED ORDER — GABAPENTIN 100 MG PO CAPS: ORAL_CAPSULE | ORAL | 3 refills | Status: AC

## 2024-01-20 MED ORDER — ZEPBOUND 2.5 MG/0.5ML ~~LOC~~ SOAJ: 2.5000 mg | SUBCUTANEOUS | 2 refills | Status: DC

## 2024-01-20 MED ORDER — ZOLPIDEM TARTRATE 10 MG PO TABS: ORAL_TABLET | ORAL | 5 refills | Status: AC

## 2024-01-20 MED ORDER — ROSUVASTATIN CALCIUM 10 MG PO TABS: 10.0000 mg | ORAL_TABLET | Freq: Every day | ORAL | 0 refills | Status: DC

## 2024-01-20 MED ORDER — CYCLOBENZAPRINE HCL 10 MG PO TABS: 10.0000 mg | ORAL_TABLET | Freq: Three times a day (TID) | ORAL | 2 refills | Status: DC | PRN

## 2024-01-20 MED ORDER — ALPRAZOLAM 1 MG PO TABS: ORAL_TABLET | ORAL | 5 refills | Status: DC

## 2024-01-20 NOTE — Patient Instructions (Signed)
 Here are some local and online Sources for zepbound and [phone] (direct pay)   [facility] CASH PAY FOR ZEPBOUND VIAL [place], [place] - [address]  Isaac Bliss  Drug store in [place] makes a compounded version of semiglutide [facility] (online  GLP)  [facility]:  (Online GLP )  Delrae Rend MD in [place] also provides medication through a [employer] program

## 2024-01-20 NOTE — Progress Notes (Unsigned)
 Subjective:  Patient ID: Bryan Galloway, male    DOB: [birth year]  Age: 58 y.o. MRN: [id number]  CC: There were no encounter diagnoses.   HPI SHASHANK KWASNIK presents for  Chief Complaint  Patient presents with   Medical Management of Chronic Issues    6 month follow up     1) OBESITY:  HAS GAINED 13 LBS SINCE AUGUST  NOT EXERCISING DUE TO WORK   2) HYPERTENSION:  Hypertension: patient checks blood pressure twice weekly at home.  Readings have been for the most part <130/80 at rest . Patient is following a reduced salt diet most days and is taking medications as prescribed   Outpatient Medications Prior to Visit  Medication Sig Dispense Refill   ALPRAZolam (XANAX) 1 MG tablet TAKE 1/2 TABLET TWICE A DAY AS NEEDED FOR ANXIETY 30 tablet 5   amLODipine (NORVASC) 10 MG tablet TAKE 1 TABLET BY MOUTH DAILY 90 tablet 3   cyanocobalamin (VITAMIN B12) 1000 MCG/ML injection Inject 1 mL (1,000 mcg total) into the muscle every 30 (thirty) days. 4 mL 2   CYANOCOBALAMIN PO Take by mouth.     cyclobenzaprine (FLEXERIL) 10 MG tablet Take 1 tablet (10 mg total) by mouth 3 (three) times daily as needed. 30 tablet 2   diclofenac (VOLTAREN) 75 MG EC tablet Take 1 tablet (75 mg total) by mouth 2 (two) times daily. 60 tablet 0   ezetimibe (ZETIA) 10 MG tablet TAKE ONE TABLET (10 MG) BY MOUTH EVERY DAY 90 tablet 3   gabapentin (NEURONTIN) 100 MG capsule Take one in the morning, & take two at bedtime. 270 capsule 3   losartan (COZAAR) 100 MG tablet TAKE 1 TABLET BY MOUTH AT BEDTIME 90 tablet 1   metFORMIN (GLUCOPHAGE-XR) 500 MG 24 hr tablet TAKE 2 TABLETS BY MOUTH DAILY WITH BREAKFAST 180 tablet 1   metoprolol succinate (TOPROL-XL) 25 MG 24 hr tablet TAKE 1 TABLET BY MOUTH DAILY. 90 tablet 3   rosuvastatin (CRESTOR) 10 MG tablet Take 1 tablet (10 mg total) by mouth daily. 30 tablet 0   tirzepatide (ZEPBOUND) 2.5 MG/0.5ML Pen Inject 2.5 mg into the skin once a week. 2 mL 2   traMADol (ULTRAM) 50 MG  tablet TAKE ONE TABLET BY MOUTH EVERY 6 HOURS AS NEEDED 90 tablet 5   Vitamin D, Ergocalciferol, 50000 units CAPS TAKE 1 CAPSULE BY MOUTH ONCE WEEKLY 12 capsule 1   zolpidem (AMBIEN) 10 MG tablet TAKE ONE TABLET AT BEDTIME IF NEEDED FORSLEEP 30 tablet 5   Facility-Administered Medications Prior to Visit  Medication Dose Route Frequency Provider Last Rate Last Admin   betamethasone acetate-betamethasone sodium phosphate (CELESTONE) injection 3 mg  3 mg Intra-articular Once Felecia Shelling, DPM        Review of Systems;  Patient denies headache, fevers, malaise, unintentional weight loss, skin rash, eye pain, sinus congestion and sinus pain, sore throat, dysphagia,  hemoptysis , cough, dyspnea, wheezing, chest pain, palpitations, orthopnea, edema, abdominal pain, nausea, melena, diarrhea, constipation, flank pain, dysuria, hematuria, urinary  Frequency, nocturia, numbness, tingling, seizures,  Focal weakness, Loss of consciousness,  Tremor, insomnia, depression, anxiety, and suicidal ideation.      Objective:  There were no vitals taken for this visit.  BP Readings from Last 3 Encounters:  07/19/23 126/74  01/16/23 (!) 148/80  07/11/22 126/64    Wt Readings from Last 3 Encounters:  07/19/23 287 lb (130.2 kg)  01/16/23 286 lb 6.4 oz (129.9 kg)  07/11/22  258 lb (117 kg)    Physical Exam  Lab Results  Component Value Date   HGBA1C 6.1 (H) 07/19/2023   HGBA1C 6.2 01/16/2023   HGBA1C 5.8 04/03/2022    Lab Results  Component Value Date   CREATININE 1.25 07/19/2023   CREATININE 1.29 02/19/2023   CREATININE 1.26 01/16/2023    Lab Results  Component Value Date   WBC 6.7 07/19/2023   HGB 15.1 07/19/2023   HCT 44.9 07/19/2023   PLT 192 07/19/2023   GLUCOSE 98 07/19/2023   CHOL 153 07/19/2023   TRIG 155 (H) 07/19/2023   HDL 43 07/19/2023   LDLDIRECT 100 (H) 07/19/2023   LDLCALC 85 07/19/2023   ALT 14 07/19/2023   AST 12 07/19/2023   NA 140 07/19/2023   K 4.5 07/19/2023    CL 106 07/19/2023   CREATININE 1.25 07/19/2023   BUN 16 07/19/2023   CO2 22 07/19/2023   TSH 1.74 07/19/2023   PSA 0.50 06/14/2021   HGBA1C 6.1 (H) 07/19/2023   MICROALBUR 2.7 (H) 01/16/2023    No results found.  Assessment & Plan:  .There are no diagnoses linked to this encounter.   I spent 34 minutes on the day of this face to face encounter reviewing patient's  most recent visit with cardiology,  nephrology,  and neurology,  prior relevant surgical and non surgical procedures, recent  labs and imaging studies, counseling on weight management,  reviewing the assessment and plan with patient, and post visit ordering and reviewing of  diagnostics and therapeutics with patient  .   Follow-up: No follow-ups on file.   Sherlene Shams, MD

## 2024-01-20 NOTE — Assessment & Plan Note (Signed)
 Tolerating mounjaro but weight has plateaued. Increase dose to 5 mg daily .  Encouraged to increase his activity level.  ?

## 2024-01-21 ENCOUNTER — Encounter: Payer: Self-pay | Admitting: Internal Medicine

## 2024-01-21 DIAGNOSIS — R944 Abnormal results of kidney function studies: Secondary | ICD-10-CM | POA: Insufficient documentation

## 2024-01-21 LAB — COMPREHENSIVE METABOLIC PANEL
ALT: 19 U/L (ref 0–53)
AST: 15 U/L (ref 0–37)
Albumin: 4.5 g/dL (ref 3.5–5.2)
Alkaline Phosphatase: 49 U/L (ref 39–117)
BUN: 16 mg/dL (ref 6–23)
CO2: 26 meq/L (ref 19–32)
Calcium: 9.4 mg/dL (ref 8.4–10.5)
Chloride: 104 meq/L (ref 96–112)
Creatinine, Ser: 1.52 mg/dL — ABNORMAL HIGH (ref 0.40–1.50)
GFR: 49.67 mL/min — ABNORMAL LOW (ref >60.00)
Glucose, Bld: 109 mg/dL — ABNORMAL HIGH (ref 70–99)
Potassium: 4.4 meq/L (ref 3.5–5.1)
Sodium: 140 meq/L (ref 135–145)
Total Bilirubin: 0.5 mg/dL (ref 0.2–1.2)
Total Protein: 6.9 g/dL (ref 6.0–8.3)

## 2024-01-21 LAB — MICROALBUMIN / CREATININE URINE RATIO
Creatinine,U: 401.6 mg/dL
Microalb Creat Ratio: 7 mg/g (ref 0.0–30.0)
Microalb, Ur: 2.8 mg/dL — ABNORMAL HIGH (ref 0.0–1.9)

## 2024-01-21 LAB — PSA: PSA: 1 ng/mL (ref 0.10–4.00)

## 2024-01-21 NOTE — Assessment & Plan Note (Signed)

## 2024-01-21 NOTE — Assessment & Plan Note (Signed)
 Managed with losartan  50 mg daily.    Lab Results  Component Value Date   MICROALBUR 2.7 (H) 01/16/2023   MICROALBUR 1.3 04/03/2022

## 2024-01-21 NOTE — Assessment & Plan Note (Addendum)
 Managed with zCrestor, zetia ,  Addressed with advice for regyular participation in aerobic exercise   Lab Results  Component Value Date   CHOL 153 07/19/2023   HDL 43 07/19/2023   LDLCALC 85 07/19/2023   LDLDIRECT 100 (H) 07/19/2023   TRIG 155 (H) 07/19/2023   CHOLHDL 3.6 07/19/2023

## 2024-01-21 NOTE — Progress Notes (Addendum)
 Patient ID: BRODIN GELPI, male    DOB: 04-04-1966  Age: 58 y.o. MRN: [id number]  The patient is here for annual preventive examination and management of other chronic and acute problems.   The risk factors are reflected in the social history.   The roster of all physicians providing medical care to patient - is listed in the Snapshot section of the chart.   Activities of daily living:  The patient is 100% independent in all ADLs: dressing, toileting, feeding as well as independent mobility   Home safety : The patient has smoke detectors in the home. They wear seatbelts.  There are no unsecured firearms at home. There is no violence in the home.    There is no risks for hepatitis, STDs or HIV. There is no   history of blood transfusion. They have no travel history to infectious disease endemic areas of the world.   The patient has seen their dentist in the last six month. They have seen their eye doctor in the last year. The patinet  denies slight hearing difficulty with regard to whispered voices and some television programs.  They have deferred audiologic testing in the last year.  They do not  have excessive sun exposure. Discussed the need for sun protection: hats, long sleeves and use of sunscreen if there is significant sun exposure.    Diet: the importance of a healthy diet is discussed. They do have a healthy diet.   The benefits of regular aerobic exercise were discussed. The patien is not exercising   but is physically active on the weekends.   Depression screen: there are no signs or vegative symptoms of depression- irritability, change in appetite, anhedonia, sadness/tearfullness.   The following portions of the patient's history were reviewed and updated as appropriate: allergies, current medications, past family history, past medical history,  past surgical history, past social history  and problem list.   Visual acuity was not assessed per patient preference since the  patient has regular follow up with an  ophthalmologist. Hearing and body mass index were assessed and reviewed.    During the course of the visit the patient was educated and counseled about appropriate screening and preventive services including : fall prevention , diabetes screening, nutrition counseling, colorectal cancer screening, and recommended immunizations.    Chief Complaint:  1) OBESITY:  HAS GAINED 13 LBS SINCE AUGUST   despite taking metformin.  E has not been exercising regularly  DUE TO WORK schedule. he would liek to resume GLP 1 agonist /   2) HYPERTENSION:  Hypertension: patient checks blood pressure  once  weekly at home.  Readings have been for the most part <130/80 at rest . Patient is following a reduced salt diet most days and is taking medications as prescribed    3) Insomnia:  he is alternating between alprazolam and ambien    4) B12 deficiency:  he continues monthly IM injections due to IF ab positivity     Review of Symptoms  Patient denies headache, fevers, malaise, unintentional weight loss, skin rash, eye pain, sinus congestion and sinus pain, sore throat, dysphagia,  hemoptysis , cough, dyspnea, wheezing, chest pain, palpitations, orthopnea, edema, abdominal pain, nausea, melena, diarrhea, constipation, flank pain, dysuria, hematuria, urinary  Frequency, nocturia, numbness, tingling, seizures,  Focal weakness, Loss of consciousness,  Tremor, insomnia, depression, anxiety, and suicidal ideation.    Physical Exam:  BP 128/76 (Cuff Size: Large)   Pulse 100   Temp 98.6 F (37  C)   Wt 300 lb (136.1 kg)   SpO2 98%   BMI 37.50 kg/m    Physical Exam Vitals reviewed.  Constitutional:      General: He is not in acute distress.    Appearance: Normal appearance. He is normal weight. He is not ill-appearing, toxic-appearing or diaphoretic.  HENT:     Head: Normocephalic and atraumatic.     Right Ear: Tympanic membrane, ear canal and external ear normal. There  is no impacted cerumen.     Left Ear: Tympanic membrane, ear canal and external ear normal. There is no impacted cerumen.     Nose: Nose normal.     Mouth/Throat:     Mouth: Mucous membranes are moist.     Pharynx: Oropharynx is clear.  Eyes:     General: No scleral icterus.       Right eye: No discharge.        Left eye: No discharge.     Conjunctiva/sclera: Conjunctivae normal.  Neck:     Thyroid: No thyromegaly.     Vascular: No carotid bruit or JVD.  Cardiovascular:     Rate and Rhythm: Normal rate and regular rhythm.     Heart sounds: Normal heart sounds.  Pulmonary:     Effort: Pulmonary effort is normal. No respiratory distress.     Breath sounds: Normal breath sounds.  Abdominal:     General: Bowel sounds are normal.     Palpations: Abdomen is soft. There is no mass.     Tenderness: There is no abdominal tenderness. There is no guarding or rebound.  Musculoskeletal:        General: Normal range of motion.     Cervical back: Normal range of motion and neck supple.  Lymphadenopathy:     Cervical: No cervical adenopathy.  Skin:    General: Skin is warm and dry.  Neurological:     General: No focal deficit present.     Mental Status: He is alert and oriented to person, place, and time. Mental status is at baseline.  Psychiatric:        Mood and Affect: Mood normal.        Behavior: Behavior normal.        Thought Content: Thought content normal.        Judgment: Judgment normal.       Assessment and Plan: Visit for preventive health examination Assessment & Plan: age appropriate education and counseling updated, referrals for preventative services and immunizations addressed, dietary and smoking counseling addressed, most recent labs reviewed.  I have personally reviewed and have noted:   1) the patient's medical and social history 2) The pt's use of alcohol, tobacco, and illicit drugs 3) The patient's current medications and supplements 4) Functional ability  including ADL's, fall risk, home safety risk, hearing and visual impairment 5) Diet and physical activities 6) Evidence for depression or mood disorder 7) The patient's height, weight, and BMI have been recorded in the chart    I have made referrals, and provided counseling and education based on review of the above    Prostate cancer screening -     PSA  Essential hypertension -     Microalbumin / creatinine urine ratio -     Comprehensive metabolic panel  Class 2 severe obesity due to excess calories with serious comorbidity and body mass index (BMI) of 37.0 to 37.9 in adult [facility]) Assessment & Plan: Tolerating mounjaro but weight has plateaued. Increase dose  to 5 mg daily .  Encouraged to increase his activity level.    Morbid obesity, unspecified obesity type (HCC) Assessment & Plan: He lost 22 lbs using a GLP 1 agonist for appetite suppression. But has gained 30 lbs since the medication was stopped due to lack of coverage.  Has been taking metformin XR t 1000 mg dose without change ,  and Zepbound was not covered by insurance and PA was denied.  He would like to consider a weight loss resuming medication through a online source or through  [facility] to compound the medication.    Tubular adenoma of colon Assessment & Plan: Repeat colonoscopy will be due in 2026    Nephropathy hypertensive Assessment & Plan: Managed with losartan  50 mg daily.    Lab Results  Component Value Date   MICROALBUR 2.7 (H) 01/16/2023   MICROALBUR 1.3 04/03/2022        Chronic pain of both knees Assessment & Plan: Managed with tylenol, tramadol and diclofenac. Encouraged to begin a walking program or use of sationery bike to lose weight   Hyperlipidemia, mixed Assessment & Plan:  Managed with zCrestor, zetia ,  Addressed with advice for regyular participation in aerobic exercise   Lab Results  Component Value Date   CHOL 153 07/19/2023   HDL 43 07/19/2023   LDLCALC 85  07/19/2023   LDLDIRECT 100 (H) 07/19/2023   TRIG 155 (H) 07/19/2023   CHOLHDL 3.6 07/19/2023      Decreased calculated GFR Assessment & Plan: Advised to suspend NSAIDs and repeat BMET in 2 weeks   Orders: -     Basic metabolic panel; Future  Other orders -     ALPRAZolam; TAKE 1/2 TABLET TWICE A DAY AS NEEDED FOR ANXIETY  Dispense: 30 tablet; Refill: 5 -     Cyclobenzaprine HCl; Take 1 tablet (10 mg total) by mouth 3 (three) times daily as needed.  Dispense: 30 tablet; Refill: 2 -     Gabapentin; Take one in the morning, & take two at bedtime.  Dispense: 270 capsule; Refill: 3 -     Rosuvastatin Calcium; Take 1 tablet (10 mg total) by mouth daily.  Dispense: 30 tablet; Refill: 0 -     Zepbound; Inject 2.5 mg into the skin once a week.  Dispense: 2 mL; Refill: 2 -     Zolpidem Tartrate; TAKE ONE TABLET AT BEDTIME IF NEEDED FORSLEEP  Dispense: 30 tablet; Refill: 5    No follow-ups on file.  Sherlene Shams, MD

## 2024-01-21 NOTE — Assessment & Plan Note (Signed)
 Managed with tylenol, tramadol and diclofenac. Encouraged to begin a walking program or use of sationery bike to lose weight

## 2024-01-21 NOTE — Assessment & Plan Note (Signed)
 Repeat colonoscopy will be due in 2026

## 2024-01-21 NOTE — Addendum Note (Signed)
 Addended by: Sherlene Shams on: 01/21/2024 12:37 PM   Modules accepted: Orders

## 2024-01-21 NOTE — Assessment & Plan Note (Signed)
 Advised to suspend NSAIDs and repeat BMET in 2 weeks

## 2024-01-21 NOTE — Assessment & Plan Note (Signed)
 He lost 22 lbs using a GLP 1 agonist for appetite suppression. But has gained 30 lbs since the medication was stopped due to lack of coverage.  Has been taking metformin XR t 1000 mg dose without change ,  and Zepbound was not covered by insurance and PA was denied.  He would like to consider a weight loss resuming medication through a online source or through  [facility] to compound the medication.

## 2024-01-27 ENCOUNTER — Telehealth: Payer: Self-pay

## 2024-01-27 MED ORDER — ZEPBOUND 2.5 MG/0.5ML ~~LOC~~ SOAJ: 2.5000 mg | SUBCUTANEOUS | 2 refills | Status: DC

## 2024-01-27 NOTE — Addendum Note (Signed)
 Addended by: Sandy Salaam on: 01/27/2024 05:18 PM   Modules accepted: Orders

## 2024-01-27 NOTE — Telephone Encounter (Signed)
 Received a fax from [employer] requesting approval for the compounded Tirzepatide. If okay I will send in a new rx that states its okay to compound.

## 2024-01-27 NOTE — Telephone Encounter (Signed)
 Medication has been removed from medication list per Dr. Darrick Huntsman and pt request. Pt was no longer taking the Diclofenac.

## 2024-01-27 NOTE — Telephone Encounter (Signed)
 Resent rx with note okay to compound

## 2024-02-03 ENCOUNTER — Other Ambulatory Visit (HOSPITAL_COMMUNITY): Payer: Self-pay

## 2024-02-03 ENCOUNTER — Telehealth: Payer: Self-pay

## 2024-02-03 NOTE — Telephone Encounter (Signed)
 Pharmacy Patient Advocate Encounter  Received notification from [facility] that Prior Authorization for Zepbound 2.5MG /0.5ML pen-injectors has been DENIED.  See denial reason below. No denial letter attached in CMM. Will attach denial letter to Media tab once received.   PA #/Case ID/Reference #: [id number]

## 2024-02-03 NOTE — Addendum Note (Signed)
 Addended by: Sherlene Shams on: 02/03/2024 05:10 PM   Modules accepted: Orders

## 2024-02-03 NOTE — Telephone Encounter (Signed)
 Pharmacy Patient Advocate Encounter   Received notification from CoverMyMeds that prior authorization for Zepbound 2.5MG /0.5ML pen-injectors is required/requested.   Insurance verification completed.   The patient is insured through [facility] .   Per test claim: PA required; PA submitted to above mentioned insurance via CoverMyMeds Key/confirmation #/EOC [id number] Status is pending

## 2024-02-06 NOTE — Telephone Encounter (Signed)
 Spoke with pt Bryan Galloway and they stated that thye are currently still compounding the Semiglutide but will not take anymore orders for the Terzepatide after tomorrow. She stated that this is an FDA court order. She also stated this will eventually happen to the [facility] as well just don't know the exact date yet.

## 2024-02-21 ENCOUNTER — Other Ambulatory Visit: Payer: Self-pay | Admitting: Internal Medicine

## 2024-03-18 ENCOUNTER — Other Ambulatory Visit: Payer: Self-pay | Admitting: Internal Medicine

## 2024-04-14 ENCOUNTER — Other Ambulatory Visit: Payer: Self-pay | Admitting: Internal Medicine

## 2024-04-14 DIAGNOSIS — E559 Vitamin D deficiency, unspecified: Secondary | ICD-10-CM

## 2024-04-14 NOTE — Telephone Encounter (Signed)
 Pt has not had Vit D labs drawn in over a year. Labs still pending from February.

## 2024-05-07 ENCOUNTER — Other Ambulatory Visit: Payer: Self-pay | Admitting: Internal Medicine

## 2024-07-20 ENCOUNTER — Encounter: Payer: Self-pay | Admitting: Internal Medicine

## 2024-07-20 ENCOUNTER — Ambulatory Visit: Payer: Commercial Managed Care - PPO | Admitting: Internal Medicine

## 2024-07-20 ENCOUNTER — Telehealth: Payer: Self-pay

## 2024-07-20 VITALS — BP 124/74 | HR 54 | Ht 75.0 in | Wt 298.2 lb

## 2024-07-20 DIAGNOSIS — E66812 Obesity, class 2: Secondary | ICD-10-CM

## 2024-07-20 DIAGNOSIS — R944 Abnormal results of kidney function studies: Secondary | ICD-10-CM

## 2024-07-20 DIAGNOSIS — Z6837 Body mass index (BMI) 37.0-37.9, adult: Secondary | ICD-10-CM

## 2024-07-20 DIAGNOSIS — I10 Essential (primary) hypertension: Secondary | ICD-10-CM

## 2024-07-20 DIAGNOSIS — F5101 Primary insomnia: Secondary | ICD-10-CM

## 2024-07-20 MED ORDER — METFORMIN HCL ER 500 MG PO TB24: 1000.0000 mg | ORAL_TABLET | Freq: Every day | ORAL | 1 refills | Status: AC

## 2024-07-20 MED ORDER — CYANOCOBALAMIN 1000 MCG/ML IJ SOLN: INTRAMUSCULAR | 2 refills | Status: AC

## 2024-07-20 NOTE — Telephone Encounter (Signed)
 Medication Samples have been provided to the patient.  Drug name: Mounjaro        Strength: 2.5 mg        Qty: 1 box   LOT: [id number]  Exp.Date: 12/25/2025  Dosing instructions: Inject 2.5 mg into skin once weekly.   The patient has been instructed regarding the correct time, dose, and frequency of taking this medication, including desired effects and most common side effects.   Reinhard Schack 4:28 PM 07/20/2024

## 2024-07-20 NOTE — Progress Notes (Unsigned)
 Subjective:  Patient ID: Bryan Galloway, male    DOB: 1966-03-29  Age: 58 y.o. MRN: [id number]  CC: There were no encounter diagnoses.   HPI MAYLON SAILORS presents for  Chief Complaint  Patient presents with   Medical Management of Chronic Issues    6 month follow up    1) HYPERTENSION: Hypertension: patient checks blood pressure twice weekly at home.  Readings have been for the most part <130/80 at rest . Patient is following a reduced salt diet most days and is taking medications as prescribed   2) OBESITY: WEIGHT IS STABLE.  HSE LOST 22 LBS  USING MOUNJARO   FROM OCT 22 TO 'AUGUST 2023,   BUT HAD TO STOP THE MEDICATIO DUE TO COST AND HAS GAINED 40 LBS SINCE AUGUST 2023   3) iNSOMNIA:using         Outpatient Medications Prior to Visit  Medication Sig Dispense Refill   ALPRAZolam  (XANAX ) 1 MG tablet TAKE 1/2 TABLET TWICE A DAY AS NEEDED FOR ANXIETY 30 tablet 5   amLODipine  (NORVASC ) 10 MG tablet TAKE 1 TABLET BY MOUTH DAILY 90 tablet 3   cyanocobalamin  (VITAMIN B12) 1000 MCG/ML injection INJECT 1 ML INTO THE MUSCLE EVERY 30 DAYS 4 mL 2   CYANOCOBALAMIN  PO Take by mouth.     cyclobenzaprine  (FLEXERIL ) 10 MG tablet Take 1 tablet (10 mg total) by mouth 3 (three) times daily as needed. 30 tablet 2   ezetimibe  (ZETIA ) 10 MG tablet TAKE ONE TABLET (10 MG) BY MOUTH EVERY DAY 90 tablet 3   gabapentin  (NEURONTIN ) 100 MG capsule Take one in the morning, & take two at bedtime. 270 capsule 3   losartan  (COZAAR ) 100 MG tablet TAKE 1 TABLET BY MOUTH AT BEDTIME 90 tablet 1   metFORMIN  (GLUCOPHAGE -XR) 500 MG 24 hr tablet TAKE 2 TABLETS BY MOUTH DAILY WITH BREAKFAST 180 tablet 1   metoprolol  succinate (TOPROL -XL) 25 MG 24 hr tablet TAKE 1 TABLET BY MOUTH DAILY. 90 tablet 3   rosuvastatin  (CRESTOR ) 10 MG tablet TAKE ONE TABLET BY MOUTH DAILY 30 tablet 0   traMADol  (ULTRAM ) 50 MG tablet TAKE ONE TABLET BY MOUTH EVERY 6 HOURS AS NEEDED 90 tablet 5   Vitamin D , Ergocalciferol , (DRISDOL )  1.25 MG (50000 UNIT) CAPS capsule TAKE 1 CAPSULE BY MOUTH ONCE WEEKLY 12 capsule 1   zolpidem  (AMBIEN ) 10 MG tablet TAKE ONE TABLET AT BEDTIME IF NEEDED FORSLEEP 30 tablet 5   Facility-Administered Medications Prior to Visit  Medication Dose Route Frequency Provider Last Rate Last Admin   betamethasone  acetate-betamethasone  sodium phosphate (CELESTONE ) injection 3 mg  3 mg Intra-articular Once Evans, Brent M, DPM        Review of Systems;  Patient denies headache, fevers, malaise, unintentional weight loss, skin rash, eye pain, sinus congestion and sinus pain, sore throat, dysphagia,  hemoptysis , cough, dyspnea, wheezing, chest pain, palpitations, orthopnea, edema, abdominal pain, nausea, melena, diarrhea, constipation, flank pain, dysuria, hematuria, urinary  Frequency, nocturia, numbness, tingling, seizures,  Focal weakness, Loss of consciousness,  Tremor, insomnia, depression, anxiety, and suicidal ideation.      Objective:  BP 124/74   Pulse (!) 54   Ht 6' 3 (1.905 m)   Wt 298 lb 3.2 oz (135.3 kg)   SpO2 96%   BMI 37.27 kg/m   BP Readings from Last 3 Encounters:  07/20/24 124/74  01/21/24 128/76  07/19/23 126/74    Wt Readings from Last 3 Encounters:  07/20/24 298 lb 3.2  oz (135.3 kg)  01/21/24 300 lb (136.1 kg)  07/19/23 287 lb (130.2 kg)    Physical Exam  Lab Results  Component Value Date   HGBA1C 6.1 (H) 07/19/2023   HGBA1C 6.2 01/16/2023   HGBA1C 5.8 04/03/2022    Lab Results  Component Value Date   CREATININE 1.52 (H) 01/20/2024   CREATININE 1.25 07/19/2023   CREATININE 1.29 02/19/2023    Lab Results  Component Value Date   WBC 6.7 07/19/2023   HGB 15.1 07/19/2023   HCT 44.9 07/19/2023   PLT 192 07/19/2023   GLUCOSE 109 (H) 01/20/2024   CHOL 153 07/19/2023   TRIG 155 (H) 07/19/2023   HDL 43 07/19/2023   LDLDIRECT 100 (H) 07/19/2023   LDLCALC 85 07/19/2023   ALT 19 01/20/2024   AST 15 01/20/2024   NA 140 01/20/2024   K 4.4 01/20/2024   CL  104 01/20/2024   CREATININE 1.52 (H) 01/20/2024   BUN 16 01/20/2024   CO2 26 01/20/2024   TSH 1.74 07/19/2023   PSA 1.00 01/20/2024   HGBA1C 6.1 (H) 07/19/2023   MICROALBUR 2.8 (H) 01/20/2024    No results found.  Assessment & Plan:  .There are no diagnoses linked to this encounter.   I spent 34 minutes on the day of this face to face encounter reviewing patient's  most recent visit with cardiology,  nephrology,  and neurology,  prior relevant surgical and non surgical procedures, recent  labs and imaging studies, counseling on weight management,  reviewing the assessment and plan with patient, and post visit ordering and reviewing of  diagnostics and therapeutics with patient  .   Follow-up: No follow-ups on file.   Verneita LITTIE Kettering, MD

## 2024-07-20 NOTE — Assessment & Plan Note (Signed)
 He was successful in losing weight in the past with Mounjaro  and would like to resume.  He has been given samples of Mounjaro   2.5  mg (4 pens) to resume therapy and given information for obtaining Zepbound  from [employer]

## 2024-07-20 NOTE — Patient Instructions (Addendum)
 Mounjaro   and Zepbound  are the same medication ; but it's called zepbound  when you don't have diabetes    Here  IS THE ONLINE  Source for zepbound  for direct pay  [employer] CASH PAY FOR ZEPBOUND  VIAL [place], [place] - [id number]   The medication will be sent out in VIALs instead of pens,

## 2024-07-21 NOTE — Assessment & Plan Note (Signed)
 Improved wit increase in  losartan  to 100 mg  , continue 10 mg amlodipine    Lab Results  Component Value Date   NA 140 01/20/2024   K 4.4 01/20/2024   CL 104 01/20/2024   CO2 26 01/20/2024   Lab Results  Component Value Date   MICROALBUR 2.8 (H) 01/20/2024

## 2024-07-21 NOTE — Assessment & Plan Note (Signed)
 Drop in GFR was noted after starting diclofenac  in February.  Medication was stopped but he did not return  for repeat assessment.  Lab Results  Component Value Date   CREATININE 1.52 (H) 01/20/2024

## 2024-07-21 NOTE — Assessment & Plan Note (Signed)
 Managed with ambien  10 mg daily. Alternating with alprazolam . .. HE TYPICALLY USES 1/2 TABLET

## 2024-10-20 ENCOUNTER — Other Ambulatory Visit: Payer: Self-pay | Admitting: Internal Medicine

## 2024-10-26 ENCOUNTER — Ambulatory Visit: Admitting: Podiatry

## 2024-10-26 ENCOUNTER — Ambulatory Visit (INDEPENDENT_AMBULATORY_CARE_PROVIDER_SITE_OTHER)

## 2024-10-26 DIAGNOSIS — M7742 Metatarsalgia, left foot: Secondary | ICD-10-CM

## 2024-10-26 MED ORDER — MELOXICAM 15 MG PO TABS: 15.0000 mg | ORAL_TABLET | Freq: Every day | ORAL | 3 refills | Status: AC

## 2024-10-26 NOTE — Progress Notes (Signed)
 Subjective:  Patient ID: Bryan Galloway, male    DOB: 04-30-1966,  MRN: [id number] HPI Chief Complaint  Patient presents with   Foot Pain    Pain on the side of left foot pain to the touch x over 6 weeks. He has used heat, ice and Voltaren  and it has improved. No swelling present..    58 y.o. male presents with the above complaint.   ROS: Denies fever chills nausea vomiting muscle aches pains calf pain back pain chest pain shortness of breath.    Past Medical History:  Diagnosis Date   History of kidney stones    Hypertension    Nephrolithiasis    Past Surgical History:  Procedure Laterality Date   COLONOSCOPY WITH PROPOFOL  N/A 03/31/2020   Procedure: COLONOSCOPY WITH PROPOFOL ;  Surgeon: Janalyn Keene NOVAK, MD;  Location: [facility] ENDOSCOPY;  Service: Endoscopy;  Laterality: N/A;   JOINT REPLACEMENT     bilateral hips  [facility]    Current Outpatient Medications:    meloxicam  (MOBIC ) 15 MG tablet, Take 1 tablet (15 mg total) by mouth daily., Disp: 30 tablet, Rfl: 3   ALPRAZolam  (XANAX ) 1 MG tablet, TAKE 1/2 TABLET TWICE A DAY AS NEEDED FOR ANXIETY, Disp: 30 tablet, Rfl: 5   amLODipine  (NORVASC ) 10 MG tablet, TAKE 1 TABLET BY MOUTH DAILY, Disp: 90 tablet, Rfl: 3   cyanocobalamin  (VITAMIN B12) 1000 MCG/ML injection, INJECT 1 ML INTO THE MUSCLE EVERY 30 DAYS, Disp: 4 mL, Rfl: 2   CYANOCOBALAMIN  PO, Take by mouth., Disp: , Rfl:    cyclobenzaprine  (FLEXERIL ) 10 MG tablet, Take 1 tablet (10 mg total) by mouth 3 (three) times daily as needed., Disp: 30 tablet, Rfl: 2   ezetimibe  (ZETIA ) 10 MG tablet, TAKE ONE TABLET (10 MG) BY MOUTH EVERY DAY, Disp: 90 tablet, Rfl: 3   gabapentin  (NEURONTIN ) 100 MG capsule, Take one in the morning, & take two at bedtime., Disp: 270 capsule, Rfl: 3   losartan  (COZAAR ) 100 MG tablet, TAKE 1 TABLET BY MOUTH AT BEDTIME, Disp: 90 tablet, Rfl: 1   metFORMIN  (GLUCOPHAGE -XR) 500 MG 24 hr tablet, Take 2 tablets (1,000 mg total) by mouth daily  with breakfast., Disp: 180 tablet, Rfl: 1   metoprolol  succinate (TOPROL -XL) 25 MG 24 hr tablet, TAKE 1 TABLET BY MOUTH DAILY., Disp: 90 tablet, Rfl: 3   rosuvastatin  (CRESTOR ) 10 MG tablet, TAKE ONE TABLET BY MOUTH DAILY, Disp: 30 tablet, Rfl: 0   traMADol  (ULTRAM ) 50 MG tablet, TAKE ONE TABLET BY MOUTH EVERY 6 HOURS AS NEEDED, Disp: 90 tablet, Rfl: 5   Vitamin D , Ergocalciferol , (DRISDOL ) 1.25 MG (50000 UNIT) CAPS capsule, TAKE 1 CAPSULE BY MOUTH ONCE WEEKLY, Disp: 12 capsule, Rfl: 1   zolpidem  (AMBIEN ) 10 MG tablet, TAKE ONE TABLET AT BEDTIME IF NEEDED FORSLEEP, Disp: 30 tablet, Rfl: 5  Current Facility-Administered Medications:    betamethasone  acetate-betamethasone  sodium phosphate (CELESTONE ) injection 3 mg, 3 mg, Intra-articular, Once, Evans, Thresa HERO, DPM  No Known Allergies Review of Systems Objective:  There were no vitals filed for this visit.  General: Well developed, nourished, in no acute distress, alert and oriented x3   Dermatological: Skin is warm, dry and supple bilateral. Nails x 10 are well maintained; remaining integument appears unremarkable at this time. There are no open sores, no preulcerative lesions, no rash or signs of infection present.  Vascular: Dorsalis Pedis artery and Posterior Tibial artery pedal pulses are 2/4 bilateral with immedate capillary fill time. Pedal hair growth present.  No varicosities and no lower extremity edema present bilateral.   Neruologic: Grossly intact via light touch bilateral. Vibratory intact via tuning fork bilateral. Protective threshold with Semmes Wienstein monofilament intact to all pedal sites bilateral. Patellar and Achilles deep tendon reflexes 2+ bilateral. No Babinski or clonus noted bilateral.   Musculoskeletal: No gross boney pedal deformities bilateral. No pain, crepitus, or limitation noted with foot and ankle range of motion bilateral. Muscular strength 5/5 in all groups tested bilateral.  Mild pain on palpation medial  calcaneal tubercle of the left heel.  Mild tenderness on palpation of the fifth metatarsal base left.  There is no pitting edema no ecchymosis he has good abduction against resistance.  Gait: Unassisted, Nonantalgic.    Radiographs:  None taken  Assessment & Plan:   Assessment: Metatarsalgia  Plan: Started him on meloxicam 15 mg 1 p.o. daily follow-up with him as needed.     Kalyani Maeda T. [place], [place]

## 2024-11-12 ENCOUNTER — Other Ambulatory Visit: Payer: Self-pay | Admitting: Internal Medicine

## 2024-11-12 DIAGNOSIS — E559 Vitamin D deficiency, unspecified: Secondary | ICD-10-CM

## 2024-11-16 NOTE — Telephone Encounter (Signed)
 Refilled: 04/14/2024 Last OV: 07/20/2024 Next OV: 01/20/2025 Last vitamin d  level: 02/11/2020

## 2024-11-27 ENCOUNTER — Other Ambulatory Visit: Payer: Self-pay | Admitting: Internal Medicine

## 2025-01-20 ENCOUNTER — Ambulatory Visit: Admitting: Internal Medicine
